# Patient Record
Sex: Male | Born: 1970 | Race: White | Hispanic: No | Marital: Married | Smoking: Former smoker
Health system: Southern US, Community
[De-identification: ages and names within clinical notes are randomized; demographics above are authoritative.]

## PROBLEM LIST (undated history)

## (undated) DIAGNOSIS — I472 Ventricular tachycardia, unspecified: Secondary | ICD-10-CM

## (undated) DIAGNOSIS — I428 Other cardiomyopathies: Secondary | ICD-10-CM

## (undated) DIAGNOSIS — E785 Hyperlipidemia, unspecified: Secondary | ICD-10-CM

## (undated) DIAGNOSIS — G709 Myoneural disorder, unspecified: Secondary | ICD-10-CM

## (undated) DIAGNOSIS — I10 Essential (primary) hypertension: Secondary | ICD-10-CM

## (undated) HISTORY — PX: VASECTOMY: SHX75

---
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
## 2011-11-25 ENCOUNTER — Encounter (HOSPITAL_COMMUNITY): Payer: Self-pay | Admitting: *Deleted

## 2011-11-25 ENCOUNTER — Emergency Department (HOSPITAL_COMMUNITY)
Admission: EM | Admit: 2011-11-25 | Discharge: 2011-11-25 | Disposition: A | Discharge status: Home / Self Care | Payer: 59 | Attending: Emergency Medicine | Admitting: Emergency Medicine

## 2011-11-25 DIAGNOSIS — R21 Rash and other nonspecific skin eruption: Secondary | ICD-10-CM | POA: Insufficient documentation

## 2011-11-25 DIAGNOSIS — L259 Unspecified contact dermatitis, unspecified cause: Secondary | ICD-10-CM | POA: Insufficient documentation

## 2011-11-25 LAB — CBC
HCT: 47.5 % (ref 39.0–52.0)
Hemoglobin: 16.7 g/dL (ref 13.0–17.0)
MCHC: 35.2 g/dL (ref 30.0–36.0)
RBC: 5.33 MIL/uL (ref 4.22–5.81)

## 2011-11-25 LAB — BASIC METABOLIC PANEL
BUN: 9 mg/dL (ref 6–23)
CO2: 21 mEq/L (ref 19–32)
GFR calc non Af Amer: 87 mL/min — ABNORMAL LOW (ref >90)
Glucose, Bld: 96 mg/dL (ref 70–99)
Potassium: 4.2 mEq/L (ref 3.5–5.1)
Sodium: 136 mEq/L (ref 135–145)

## 2011-11-25 MED ORDER — HYDROCORTISONE 2.5 % EX LOTN: TOPICAL_LOTION | Freq: Two times a day (BID) | CUTANEOUS | Status: AC

## 2011-11-25 MED ORDER — PREDNISONE 20 MG PO TABS: 60.0000 mg | ORAL_TABLET | Freq: Every day | ORAL | Status: AC

## 2011-11-25 MED ORDER — METHYLPREDNISOLONE SODIUM SUCC 125 MG IJ SOLR
125.0000 mg | INTRAMUSCULAR | Status: AC
  Administered 2011-11-25: 125 mg via INTRAVENOUS
  Filled 2011-11-25: qty 2

## 2011-11-25 MED ORDER — MORPHINE SULFATE 4 MG/ML IJ SOLN
4.0000 mg | Freq: Once | INTRAMUSCULAR | Status: AC
  Administered 2011-11-25: 4 mg via INTRAVENOUS
  Filled 2011-11-25: qty 1

## 2011-11-25 MED ORDER — TRAMADOL HCL 50 MG PO TABS: 50.0000 mg | ORAL_TABLET | Freq: Four times a day (QID) | ORAL | Status: AC | PRN

## 2011-11-25 MED ORDER — DIPHENHYDRAMINE HCL 50 MG/ML IJ SOLN
25.0000 mg | Freq: Once | INTRAMUSCULAR | Status: AC
  Administered 2011-11-25: 25 mg via INTRAVENOUS
  Filled 2011-11-25: qty 1

## 2011-11-25 NOTE — ED Notes (Signed)
Md informed of pt complain of severe itching to hands and thighs.

## 2011-11-25 NOTE — ED Notes (Signed)
Both his hands are red and swelling since yesterday and.  Redness all over his body now

## 2011-11-25 NOTE — ED Notes (Signed)
Rashes to left and right inner thigh, bilat hand swollen with raised red rashes, pt  stated  Severe itching and rashes painful.

## 2011-11-25 NOTE — ED Provider Notes (Signed)
History     CSN: [id number]  Arrival date & time 11/25/11  1742   First MD Initiated Contact with Patient 11/25/11 1754      Chief Complaint  Patient presents with  . swollen hands     (Consider location/radiation/quality/duration/timing/severity/associated sxs/prior treatment) Patient is a 41 y.o. male presenting with rash.  Rash  This is a new problem. The current episode started 6 to 12 hours ago. The problem has been rapidly worsening. The problem is associated with an unknown factor. There has been no fever. The rash is present on the groin, left hand, left wrist, right hand, right wrist, right ankle, right buttock, left buttock and left ankle. The pain is moderate. The pain has been constant since onset. Associated symptoms include itching and pain. Pertinent negatives include no blisters and no weeping. He has tried nothing for the symptoms. The treatment provided no relief. Risk factors: none.    History reviewed. No pertinent past medical history.  History reviewed. No pertinent past surgical history.  No family history on file.  History  Substance Use Topics  . Smoking status: Never Smoker   . Smokeless tobacco: Not on file  . Alcohol Use: No      Review of Systems  Unable to perform ROS Constitutional: Negative for fever and chills.  HENT: Negative for congestion, rhinorrhea and trouble swallowing.   Eyes: Negative for visual disturbance.  Respiratory: Negative for cough, chest tightness, shortness of breath and wheezing.   Cardiovascular: Negative for chest pain and palpitations.  Gastrointestinal: Negative for nausea, vomiting and abdominal pain.  Genitourinary: Negative for genital sores.  Musculoskeletal: Negative for myalgias and arthralgias.  Skin: Positive for itching and rash. Negative for color change and wound.  Neurological: Negative for light-headedness and headaches.  Hematological: Negative for adenopathy.  All other systems reviewed and are  negative.    Allergies  Review of patient's allergies indicates not on file.  Home Medications  No current outpatient prescriptions on file.  BP 124/87  Pulse 100  Temp 98.3 F (36.8 C) (Oral)  Resp 20  SpO2 98%  Physical Exam  Nursing note and vitals reviewed. Constitutional: He is oriented to person, place, and time. He appears well-developed and well-nourished.  HENT:  Head: Normocephalic and atraumatic.  Mouth/Throat: Oropharynx is clear and moist.  Eyes: EOM are normal. Pupils are equal, round, and reactive to light.  Cardiovascular: Normal rate and regular rhythm.   Pulmonary/Chest: Effort normal and breath sounds normal. No respiratory distress. He has no wheezes.  Abdominal: Soft. Bowel sounds are normal. There is no tenderness.  Neurological: He is alert and oriented to person, place, and time.  Skin: Skin is warm and dry.     Psychiatric: He has a normal mood and affect.    ED Course  Procedures (including critical care time)  Labs Reviewed  CBC - Abnormal; Notable for the following:    WBC 11.9 (*)     All other components within normal limits  BASIC METABOLIC PANEL - Abnormal; Notable for the following:    GFR calc non Af Amer 87 (*)     All other components within normal limits   No results found.   1. Contact dermatitis       MDM  This is a 41 year old male who presents with a rash and spine. He states that last night he had some itching to the volar surface of his bilateral wrists, and this morning woke up with some erythema and some  swelling of his bilateral wrists. He also noticed some raised areas scattered across the back of his hands and his anterior ankles which he described as "looking like pimples. He says that progressively throughout the day he has had significant swelling of his bilateral hands, making it difficult to move them, he is also had spread of the rash across his hands, his anterior ankles, and his groin area. The patient's  denies any known new exposures, has never had similar rashes before. He denies any shortness of breath, nausea, dizziness, lightheadedness, any other complaints. He exam is as above, and shows diffuse rash, but sparing the torso, back, and face. Will treat the patient's symptoms with Benadryl, steroids, and pain medication the rash this is below 1 the patient's ankles, as well as on the back of his left knee appear like a contact dermatitis, there are some areas on the hands which appears like a contact dermatitis however it is difficult to tell for sure due to the significant amount of swelling. The rash along his client appears more consistent with hives, and is uncertain if this is from the same exposure or from something else, but will be treated by the medications already being given for the contact dermatitis.  Labs with mild leukocytosis but otherwise unremarkable. Patient with improvement of the swelling in his hands bilaterally, with improved movement of his fingers. He has had improvement of his itching and his pain with the treatment provided. Discussed with the patient and he tripped at home, follow up with his regular doctor for further evaluation, and indications for return. The patient expressed understanding.      Theotis Burrow, MD 11/25/11 2255

## 2011-11-25 NOTE — ED Provider Notes (Signed)
I saw and evaluated the patient, reviewed the resident's note and I agree with the findings and plan.  Tyshaun Vinzant, MD 11/25/11 2352 

## 2014-06-30 ENCOUNTER — Emergency Department (HOSPITAL_COMMUNITY)
Admission: EM | Admit: 2014-06-30 | Discharge: 2014-06-30 | Discharge status: Against Medical Advice | Payer: 59 | Attending: Internal Medicine | Admitting: Internal Medicine

## 2014-06-30 ENCOUNTER — Emergency Department (HOSPITAL_COMMUNITY): Payer: 59

## 2014-06-30 DIAGNOSIS — I10 Essential (primary) hypertension: Secondary | ICD-10-CM | POA: Diagnosis not present

## 2014-06-30 DIAGNOSIS — I472 Ventricular tachycardia, unspecified: Secondary | ICD-10-CM

## 2014-06-30 DIAGNOSIS — R079 Chest pain, unspecified: Secondary | ICD-10-CM

## 2014-06-30 LAB — BASIC METABOLIC PANEL
Anion gap: 11 (ref 5–15)
BUN: 13 mg/dL (ref 6–23)
CALCIUM: 10.1 mg/dL (ref 8.4–10.5)
CO2: 24 mmol/L (ref 19–32)
CREATININE: 1.23 mg/dL (ref 0.50–1.35)
Chloride: 103 mmol/L (ref 96–112)
GFR calc Af Amer: 82 mL/min — ABNORMAL LOW (ref >90)
GFR, EST NON AFRICAN AMERICAN: 70 mL/min — AB (ref >90)
GLUCOSE: 162 mg/dL — AB (ref 70–99)
POTASSIUM: 3.9 mmol/L (ref 3.5–5.1)
SODIUM: 138 mmol/L (ref 135–145)

## 2014-06-30 LAB — CBC WITH DIFFERENTIAL/PLATELET
Basophils Absolute: 0 10*3/uL (ref 0.0–0.1)
Basophils Relative: 0 % (ref 0–1)
Eosinophils Absolute: 0.2 10*3/uL (ref 0.0–0.7)
Eosinophils Relative: 2 % (ref 0–5)
HCT: 48.4 % (ref 39.0–52.0)
Hemoglobin: 17 g/dL (ref 13.0–17.0)
Lymphocytes Relative: 36 % (ref 12–46)
Lymphs Abs: 3.5 10*3/uL (ref 0.7–4.0)
MCH: 31.5 pg (ref 26.0–34.0)
MCHC: 35.1 g/dL (ref 30.0–36.0)
MCV: 89.8 fL (ref 78.0–100.0)
MONO ABS: 0.6 10*3/uL (ref 0.1–1.0)
Monocytes Relative: 6 % (ref 3–12)
NEUTROS ABS: 5.3 10*3/uL (ref 1.7–7.7)
NEUTROS PCT: 56 % (ref 43–77)
PLATELETS: 248 10*3/uL (ref 150–400)
RBC: 5.39 MIL/uL (ref 4.22–5.81)
RDW: 13.4 % (ref 11.5–15.5)
WBC: 9.6 10*3/uL (ref 4.0–10.5)

## 2014-06-30 LAB — MAGNESIUM: Magnesium: 2.1 mg/dL (ref 1.5–2.5)

## 2014-06-30 LAB — TROPONIN I: Troponin I: 0.07 ng/mL — ABNORMAL HIGH (ref <0.031)

## 2014-06-30 LAB — I-STAT TROPONIN, ED: Troponin i, poc: 0.04 ng/mL (ref 0.00–0.08)

## 2014-06-30 MED ORDER — AMIODARONE HCL IN DEXTROSE 360-4.14 MG/200ML-% IV SOLN
60.0000 mg/h | Freq: Once | INTRAVENOUS | Status: AC
  Administered 2014-06-30: 60 mg/h via INTRAVENOUS
  Filled 2014-06-30: qty 200

## 2014-06-30 MED ORDER — HEPARIN BOLUS VIA INFUSION
4000.0000 [IU] | Freq: Once | INTRAVENOUS | Status: DC
  Filled 2014-06-30: qty 4000

## 2014-06-30 MED ORDER — ASPIRIN 81 MG PO CHEW
324.0000 mg | CHEWABLE_TABLET | ORAL | Status: DC
  Filled 2014-06-30: qty 4

## 2014-06-30 MED ORDER — MIDAZOLAM HCL 2 MG/2ML IJ SOLN
INTRAMUSCULAR | Status: AC
  Filled 2014-06-30: qty 2

## 2014-06-30 MED ORDER — NITROGLYCERIN 0.4 MG SL SUBL: 0.4000 mg | SUBLINGUAL_TABLET | SUBLINGUAL | Status: DC | PRN

## 2014-06-30 MED ORDER — AMIODARONE IV BOLUS ONLY 150 MG/100ML
150.0000 mg | Freq: Once | INTRAVENOUS | Status: AC
  Administered 2014-06-30: 150 mg via INTRAVENOUS

## 2014-06-30 MED ORDER — ACETAMINOPHEN 325 MG PO TABS: 650.0000 mg | ORAL_TABLET | ORAL | Status: DC | PRN

## 2014-06-30 MED ORDER — ASPIRIN EC 81 MG PO TBEC: 81.0000 mg | DELAYED_RELEASE_TABLET | Freq: Every day | ORAL | Status: DC

## 2014-06-30 MED ORDER — ONDANSETRON HCL 4 MG/2ML IJ SOLN: 4.0000 mg | Freq: Four times a day (QID) | INTRAMUSCULAR | Status: DC | PRN

## 2014-06-30 MED ORDER — ASPIRIN 300 MG RE SUPP: 300.0000 mg | RECTAL | Status: DC

## 2014-06-30 MED ORDER — HEPARIN (PORCINE) IN NACL 100-0.45 UNIT/ML-% IJ SOLN
1100.0000 [IU]/h | INTRAMUSCULAR | Status: DC
  Filled 2014-06-30: qty 250

## 2014-06-30 MED ORDER — ETOMIDATE 2 MG/ML IV SOLN
INTRAVENOUS | Status: AC
  Filled 2014-06-30: qty 10

## 2014-06-30 NOTE — ED Notes (Signed)
Pt states that the chest pain and pressure have subsided since his heart rate has converted

## 2014-06-30 NOTE — ED Notes (Signed)
Pt is in a gown and on the monitor. Family is at bedside

## 2014-06-30 NOTE — Progress Notes (Signed)
ANTICOAGULATION CONSULT NOTE - Initial Consult  Pharmacy Consult for heparin Indication: chest pain/ACS  No Known Allergies  Patient Measurements: Height: 5\' 10"  (177.8 cm) Weight: 200 lb (90.719 kg) IBW/kg (Calculated) : 73 Heparin Dosing Weight: 90kg  Vital Signs: Temp: 98.3 F (36.8 C) (02/07 1500) Temp Source: Oral (02/07 1500) BP: 113/85 mmHg (02/07 1600) Pulse Rate: 87 (02/07 1600)  Labs:  Recent Labs  06/30/14 1509  HGB 17.0  HCT 48.4  PLT 248  CREATININE 1.23  TROPONINI 0.07*    Estimated Creatinine Clearance: 87.7 mL/min (by C-G formula based on Cr of 1.23).   Medical History: No past medical history on file.  Medications:  Infusions:  . heparin    . heparin      Assessment: 29 yom presented to the ED with CP and near syncope. Troponin elevated. To start IV heparin for anticoagulation. Baseline CBC is WNL. He is not on any anticoagulation PTA.   Goal of Therapy:  Heparin level 0.3-0.7 units/ml Monitor platelets by anticoagulation protocol: Yes   Plan:  1. Heparin bolus 4000 units IV x 1 2. Heparin gtt 1100 units/hr 3. Check a 6 hour HL 4. Daily HL and CBC  Nacole Fluhr, Drake Leach 06/30/2014,5:06 PM

## 2014-06-30 NOTE — ED Notes (Signed)
Before starting the amiodarone gtt pt converted to a SR , repeat ekg done, amiodarone bolus given and gtt started

## 2014-06-30 NOTE — ED Provider Notes (Signed)
CSN: [id number]     Arrival date & time 06/30/14  1455 History   First MD Initiated Contact with Patient 06/30/14 1507     Chief Complaint  Patient presents with  . Chest Pain     (Consider location/radiation/quality/duration/timing/severity/associated sxs/prior Treatment) Patient is a 44 y.o. male presenting with chest pain. The history is provided by the patient. No language interpreter was used.  Chest Pain Pain location:  Substernal area Pain quality: pressure   Pain radiates to:  Does not radiate Pain radiates to the back: no   Pain severity:  Severe Onset quality:  Sudden Duration:  1 hour Timing:  Constant Progression:  Unchanged Chronicity:  New Context: not breathing, no drug use, not eating, not lifting, no movement, not at rest and no stress   Relieved by:  Nothing Worsened by:  Nothing tried Ineffective treatments:  None tried Associated symptoms: no abdominal pain, no anxiety, no back pain, no cough, no fever, no nausea, no numbness, no orthopnea, no syncope, not vomiting and no weakness   Risk factors: male sex   Risk factors: no aortic disease, no birth control, no coronary artery disease, no diabetes mellitus, no high cholesterol, no hypertension and no prior DVT/PE     No past medical history on file. No past surgical history on file. No family history on file. History  Substance Use Topics  . Smoking status: Never Smoker   . Smokeless tobacco: Not on file  . Alcohol Use: No    Review of Systems  Constitutional: Negative for fever.  Respiratory: Negative for cough.   Cardiovascular: Positive for chest pain. Negative for orthopnea and syncope.  Gastrointestinal: Negative for nausea, vomiting and abdominal pain.  Genitourinary: Negative for dysuria, urgency and frequency.  Musculoskeletal: Negative for back pain.  Neurological: Negative for weakness and numbness.  All other systems reviewed and are negative.     Allergies  Review of patient's  allergies indicates no known allergies.  Home Medications   Prior to Admission medications   Medication Sig Start Date End Date Taking? Authorizing Provider  Aspirin-Acetaminophen-Caffeine (GOODY HEADACHE PO) Take 1 packet by mouth daily as needed (pain). For headache   Yes Historical Provider, MD   BP 113/85 mmHg  Pulse 87  Temp(Src) 98.3 F (36.8 C) (Oral)  Resp 25  Ht  (1.778 m)  Wt 200 lb (90.719 kg)  BMI 28.70 kg/m2  SpO2 96% Physical Exam  Constitutional: He is oriented to person, place, and time. He appears well-developed and well-nourished. No distress.  HENT:  Head: Normocephalic and atraumatic.  Eyes: Pupils are equal, round, and reactive to light.  Neck: Normal range of motion.  Cardiovascular: Regular rhythm and normal heart sounds.  Tachycardia present.   Pulmonary/Chest: Effort normal and breath sounds normal. No respiratory distress. He has no decreased breath sounds. He has no wheezes. He has no rhonchi. He has no rales.  Abdominal: Soft. He exhibits no distension. There is no tenderness. There is no rebound and no guarding.  Musculoskeletal: He exhibits no edema or tenderness.  Neurological: He is alert and oriented to person, place, and time. He exhibits normal muscle tone.  Skin: Skin is warm and dry.  Nursing note and vitals reviewed.   ED Course  Procedures (including critical care time) Labs Review Labs Reviewed  BASIC METABOLIC PANEL - Abnormal; Notable for the following:    Glucose, Bld 162 (*)    GFR calc non Af Amer 70 (*)    GFR calc  Af Amer 82 (*)    All other components within normal limits  TROPONIN I - Abnormal; Notable for the following:    Troponin I 0.07 (*)    All other components within normal limits  CBC WITH DIFFERENTIAL/PLATELET  MAGNESIUM  HEPARIN LEVEL (UNFRACTIONATED)  CBC  I-STAT TROPOININ, ED    Imaging Review Dg Chest Port 1 View  06/30/2014   CLINICAL DATA:  Acute onset of chest pain, palpitations and shortness of  breath which began earlier today. Patient had a ventricular arrhythmia on EKG in the emergency department which was converted and the patient has received a bolus of amiodarone.  EXAM: PORTABLE CHEST - 1 VIEW  COMPARISON:  None.  FINDINGS: Cardiac silhouette mildly to moderately enlarged even allowing for the AP portable technique. Elevation of right hemidiaphragm with associated volume loss in the right lower lobe. Lungs otherwise clear. No localized airspace consolidation. No pleural effusions. No pneumothorax. Normal pulmonary vascularity. External pacing pads.  IMPRESSION: Cardiomegaly. Elevation of the right hemidiaphragm with likely chronic scar or atelectasis in the right lower lobe. No acute cardiopulmonary disease otherwise.   Electronically Signed   By: Hulan Saas M.D.   On: 06/30/2014 16:15     EKG Interpretation   Date/Time:  Sunday June 30 2014 15:21:38 EST Ventricular Rate:  99 PR Interval:  212 QRS Duration: 100 QT Interval:  324 QTC Calculation: 416 R Axis:   104 Text Interpretation:  Sinus tachycardia Ventricular premature complex  Prolonged PR interval Right axis deviation Repol abnrm, severe global  ischemia (LM/MVD) Baseline wander in lead(s) V3 Confirmed by ZAVITZ  MD,  JOSHUA ([id number]) on 06/30/2014 4:55:29 PM      MDM   Final diagnoses:  Chest pain  Ventricular tachycardia   Patient is a 44 year old Caucasian male with past medical history of hypertension who comes to the emergency department today with left-sided chest pressure for the past hour. Physical exam as above. Upon arrival patient is tachycardic with heart rate in the 211s. Initial workup included an EKG which is detailed above. EKG is concerning for ventricular fibrillation. Patient has a blood pressure of 90/50. He was felt to require cardioversion with his chest pain and hypotension with ventricular fibrillation. While being set up for cardioversion patient spontaneously cardioverted to normal sinus  rhythm. Initial workup included an i-STAT troponin, BMP, CBC, magnesium, and chest x-ray.  After conversion patient's symptoms completely resolved with no hypotension and no shortness of breath or chest pressure. Patient's care was discussed with cardiology who felt the patient required admission to the hospital. Prior to admission patient left AMA.  Cardiology held the conversations regarding patient's reason for leaving AMA with the patient and his family. Labs and imaging reviewed by myself and considered in medical decision making. Imaging interpreted by radiology. Care was discussed with my attending Dr. Jodi Mourning.       Bethann Berkshire, MD 06/30/14 1951  Enid Skeens, MD 06/30/14 [id number]

## 2014-06-30 NOTE — H&P (Signed)
James Fowler is an 44 y.o. male.    Chief Complaint: palpitations and near syncope  HPI: The patient is a very pleasant 44 year old man whose history dates back 9 years when he presented to an outside hospital in [place] with palpitations. Workup at that time is uncertain. He did well until today when he presented after experiencing sudden onset of palpitations associated with near syncope. These episodes were associated with shortness of breath and chest pressure. He presented to the emergency room where he was in a wide QRS tachycardia at 213 bpm. This was a left bundle indeterminate axis tachycardia. It looks like ventricular tachycardia. Just as he was about to be cardioverted, he spontaneously reverted to sinus rhythm. Just prior to conversion, his tachycardia became polymorphic. He feels better. He denies shortness of breath or chest pressure at baseline. He is not limited in his activity. He has never had syncope and there is no family history of sudden cardiac death.  No past medical history on file. He has no significant past medical history with the exception of the episode of palpitations which occurred 9 years ago. No past surgical history on file. No prior surgeries No family history on file. Social History:  reports that he has never smoked. He does not have any smokeless tobacco history on file. He reports that he does not drink alcohol. His drug history is not on file.  Allergies: No Known Allergies Medications - he is on no medications  (Not in a hospital admission)  Results for orders placed or performed during the hospital encounter of 06/30/14 (from the past 48 hour(s))  Basic metabolic panel     Status: Abnormal   Collection Time: 06/30/14  3:09 PM  Result Value Ref Range   Sodium 138 135 - 145 mmol/L   Potassium 3.9 3.5 - 5.1 mmol/L   Chloride 103 96 - 112 mmol/L   CO2 24 19 - 32 mmol/L   Glucose, Bld 162 (H) 70 - 99 mg/dL   BUN 13 6 - 23 mg/dL   Creatinine, Ser  1.23 0.50 - 1.35 mg/dL   Calcium 10.1 8.4 - 10.5 mg/dL   GFR calc non Af Amer 70 (L) >90 mL/min   GFR calc Af Amer 82 (L) >90 mL/min    Comment: (NOTE) The eGFR has been calculated using the CKD EPI equation. This calculation has not been validated in all clinical situations. eGFR's persistently <90 mL/min signify possible Chronic Kidney Disease.    Anion gap 11 5 - 15  CBC with Differential/Platelet     Status: None   Collection Time: 06/30/14  3:09 PM  Result Value Ref Range   WBC 9.6 4.0 - 10.5 K/uL   RBC 5.39 4.22 - 5.81 MIL/uL   Hemoglobin 17.0 13.0 - 17.0 g/dL   HCT 48.4 39.0 - 52.0 %   MCV 89.8 78.0 - 100.0 fL   MCH 31.5 26.0 - 34.0 pg   MCHC 35.1 30.0 - 36.0 g/dL   RDW 13.4 11.5 - 15.5 %   Platelets 248 150 - 400 K/uL   Neutrophils Relative % 56 43 - 77 %   Neutro Abs 5.3 1.7 - 7.7 K/uL   Lymphocytes Relative 36 12 - 46 %   Lymphs Abs 3.5 0.7 - 4.0 K/uL   Monocytes Relative 6 3 - 12 %   Monocytes Absolute 0.6 0.1 - 1.0 K/uL   Eosinophils Relative 2 0 - 5 %   Eosinophils Absolute 0.2 0.0 - 0.7 K/uL  Basophils Relative 0 0 - 1 %   Basophils Absolute 0.0 0.0 - 0.1 K/uL  Troponin I     Status: Abnormal   Collection Time: 06/30/14  3:09 PM  Result Value Ref Range   Troponin I 0.07 (H) <0.031 ng/mL    Comment:        PERSISTENTLY INCREASED TROPONIN VALUES IN THE RANGE OF 0.04-0.49 ng/mL CAN BE SEEN IN:       -UNSTABLE ANGINA       -CONGESTIVE HEART FAILURE       -MYOCARDITIS       -CHEST TRAUMA       -ARRYHTHMIAS       -LATE PRESENTING MYOCARDIAL INFARCTION       -COPD   CLINICAL FOLLOW-UP RECOMMENDED.   Magnesium     Status: None   Collection Time: 06/30/14  3:09 PM  Result Value Ref Range   Magnesium 2.1 1.5 - 2.5 mg/dL  I-stat troponin, ED     Status: None   Collection Time: 06/30/14  3:40 PM  Result Value Ref Range   Troponin i, poc 0.04 0.00 - 0.08 ng/mL   Comment 3            Comment: Due to the release kinetics of cTnI, a negative result within  the first hours of the onset of symptoms does not rule out myocardial infarction with certainty. If myocardial infarction is still suspected, repeat the test at appropriate intervals.    Dg Chest Port 1 View  06/30/2014   CLINICAL DATA:  Acute onset of chest pain, palpitations and shortness of breath which began earlier today. Patient had a ventricular arrhythmia on EKG in the emergency department which was converted and the patient has received a bolus of amiodarone.  EXAM: PORTABLE CHEST - 1 VIEW  COMPARISON:  None.  FINDINGS: Cardiac silhouette mildly to moderately enlarged even allowing for the AP portable technique. Elevation of right hemidiaphragm with associated volume loss in the right lower lobe. Lungs otherwise clear. No localized airspace consolidation. No pleural effusions. No pneumothorax. Normal pulmonary vascularity. External pacing pads.  IMPRESSION: Cardiomegaly. Elevation of the right hemidiaphragm with likely chronic scar or atelectasis in the right lower lobe. No acute cardiopulmonary disease otherwise.   Electronically Signed   By: Evangeline Dakin M.D.   On: 06/30/2014 16:15    ROS  Blood pressure 113/85, pulse 87, temperature 98.3 F (36.8 C), temperature source Oral, resp. rate 25, height '5\' 10"'  (1.778 m), weight 200 lb (90.719 kg), SpO2 96 %. Physical Exam  Well appearing 44 year old man, NAD HEENT: Unremarkable,[place], AT Neck:  7 cm JVD, no thyromegally Back:  No CVA tenderness Lungs:  Clear with no wheezes, rales, or rhonchi HEART:  Regular rate rhythm, no murmurs, no rubs, no clicks Abd:  soft, positive bowel sounds, no organomegally, no rebound, no guarding Ext:  2 plus pulses, no edema, no cyanosis, no clubbing Skin:  No rashes no nodules Neuro:  CN II through XII intact, motor grossly intact  EKG: Left bundle-branch wide QRS tachycardia at 213 beats minute  Assessment/Plan 1. Probable ventricular tachycardia 2. Palpitations Discussion: The patient probably  has ventricular tachycardia which terminated spontaneously and was associated with hemodynamic instability. He needs cardiac catheterization and evaluation of left ventricular function. Additional recommendations including electrophysiologic testing will be forthcoming. I discussed the potential life-threatening nature of this problem with the patient and his wife who understand. He was initially started on amiodarone in the emergency room. This will be  discontinued. We will observe him in the ICU. He will be ruled out for MI.  Cristopher Peru 06/30/2014, 4:45 PM

## 2014-06-30 NOTE — ED Notes (Signed)
Pt reports chest pain and SOB that started earlier today. States that he feels like his heart is racing. States he has had problems with his heart in the past but unsure of what happened.

## 2014-06-30 NOTE — ED Notes (Signed)
Pt signed out  AMA , pt refused further treatment , Cards MD into see pt ,pt still signed out [facility]

## 2014-06-30 NOTE — Progress Notes (Signed)
Brief X-Cover Note  I was notified by the ER team that [name] was leaving AMA. I came down immediately and he had already signed out and had his IV taken out. I still attempted to discuss our findings of VT and need for further evaluation for a potentially life threatening medical condition. He was adamant about leaving and said he had an appointment with his PCP In the morning. I reinforced and had him repeat that he understand he was leaving against medical recommendation with a potentially life threatening disorder; he expressed his understanding. I told him to call 911 with any recurrent symptoms, concerns or changes in condition.   Leeann Must, MD

## 2014-06-30 NOTE — ED Notes (Signed)
Pt placed on zoll pads , placed on O2 at 4 liters ,

## 2014-07-01 ENCOUNTER — Inpatient Hospital Stay (HOSPITAL_COMMUNITY)
Admission: EM | Admit: 2014-07-01 | Discharge: 2014-07-05 | DRG: 225 | Disposition: A | Discharge status: Home / Self Care | Payer: 59 | Attending: Internal Medicine | Admitting: Internal Medicine

## 2014-07-01 ENCOUNTER — Encounter (HOSPITAL_COMMUNITY): Payer: Self-pay | Admitting: Emergency Medicine

## 2014-07-01 DIAGNOSIS — E780 Pure hypercholesterolemia: Secondary | ICD-10-CM | POA: Diagnosis present

## 2014-07-01 DIAGNOSIS — R000 Tachycardia, unspecified: Secondary | ICD-10-CM

## 2014-07-01 DIAGNOSIS — I472 Ventricular tachycardia, unspecified: Secondary | ICD-10-CM

## 2014-07-01 DIAGNOSIS — I447 Left bundle-branch block, unspecified: Secondary | ICD-10-CM | POA: Diagnosis present

## 2014-07-01 DIAGNOSIS — E785 Hyperlipidemia, unspecified: Secondary | ICD-10-CM | POA: Diagnosis present

## 2014-07-01 DIAGNOSIS — I10 Essential (primary) hypertension: Secondary | ICD-10-CM | POA: Diagnosis present

## 2014-07-01 DIAGNOSIS — I493 Ventricular premature depolarization: Secondary | ICD-10-CM | POA: Diagnosis present

## 2014-07-01 DIAGNOSIS — Z95810 Presence of automatic (implantable) cardiac defibrillator: Secondary | ICD-10-CM

## 2014-07-01 DIAGNOSIS — I429 Cardiomyopathy, unspecified: Secondary | ICD-10-CM

## 2014-07-01 HISTORY — DX: Hyperlipidemia, unspecified: E78.5

## 2014-07-01 HISTORY — DX: Ventricular tachycardia, unspecified: I47.20

## 2014-07-01 HISTORY — DX: Essential (primary) hypertension: I10

## 2014-07-01 HISTORY — DX: Other cardiomyopathies: I42.8

## 2014-07-01 HISTORY — DX: Ventricular tachycardia: I47.2

## 2014-07-01 LAB — CBC WITH DIFFERENTIAL/PLATELET
BASOS ABS: 0 10*3/uL (ref 0.0–0.1)
Basophils Relative: 1 % (ref 0–1)
EOS ABS: 0.1 10*3/uL (ref 0.0–0.7)
Eosinophils Relative: 2 % (ref 0–5)
HCT: 45 % (ref 39.0–52.0)
HEMOGLOBIN: 15.8 g/dL (ref 13.0–17.0)
LYMPHS PCT: 32 % (ref 12–46)
Lymphs Abs: 2 10*3/uL (ref 0.7–4.0)
MCH: 31.5 pg (ref 26.0–34.0)
MCHC: 35.1 g/dL (ref 30.0–36.0)
MCV: 89.6 fL (ref 78.0–100.0)
MONO ABS: 0.5 10*3/uL (ref 0.1–1.0)
Monocytes Relative: 9 % (ref 3–12)
NEUTROS ABS: 3.4 10*3/uL (ref 1.7–7.7)
Neutrophils Relative %: 56 % (ref 43–77)
PLATELETS: 220 10*3/uL (ref 150–400)
RBC: 5.02 MIL/uL (ref 4.22–5.81)
RDW: 13.6 % (ref 11.5–15.5)
WBC: 6.1 10*3/uL (ref 4.0–10.5)

## 2014-07-01 LAB — RAPID URINE DRUG SCREEN, HOSP PERFORMED
Amphetamines: NOT DETECTED
BARBITURATES: NOT DETECTED
Benzodiazepines: NOT DETECTED
Cocaine: NOT DETECTED
OPIATES: NOT DETECTED
TETRAHYDROCANNABINOL: NOT DETECTED

## 2014-07-01 LAB — BASIC METABOLIC PANEL
ANION GAP: 3 — AB (ref 5–15)
BUN: 11 mg/dL (ref 6–23)
CO2: 25 mmol/L (ref 19–32)
Calcium: 10 mg/dL (ref 8.4–10.5)
Chloride: 114 mmol/L — ABNORMAL HIGH (ref 96–112)
Creatinine, Ser: 0.97 mg/dL (ref 0.50–1.35)
GFR calc non Af Amer: >90 mL/min (ref >90)
Glucose, Bld: 90 mg/dL (ref 70–99)
Potassium: 4 mmol/L (ref 3.5–5.1)
SODIUM: 142 mmol/L (ref 135–145)

## 2014-07-01 LAB — TROPONIN I: Troponin I: 0.53 ng/mL (ref <0.031)

## 2014-07-01 MED ORDER — SODIUM CHLORIDE 0.9 % IV SOLN: 250.0000 mL | INTRAVENOUS | Status: DC | PRN

## 2014-07-01 MED ORDER — SODIUM CHLORIDE 0.9 % IV SOLN
INTRAVENOUS | Status: DC
Start: 2014-07-02 — End: 2014-07-02
  Administered 2014-07-02: 06:00:00 via INTRAVENOUS

## 2014-07-01 MED ORDER — SODIUM CHLORIDE 0.9 % IJ SOLN: 3.0000 mL | Freq: Two times a day (BID) | INTRAMUSCULAR | Status: DC

## 2014-07-01 MED ORDER — ACETAMINOPHEN 325 MG PO TABS: 650.0000 mg | ORAL_TABLET | ORAL | Status: DC | PRN

## 2014-07-01 MED ORDER — SODIUM CHLORIDE 0.9 % IV SOLN
INTRAVENOUS | Status: DC
  Administered 2014-07-01: 20 mL/h via INTRAVENOUS

## 2014-07-01 MED ORDER — NITROGLYCERIN 0.4 MG SL SUBL: 0.4000 mg | SUBLINGUAL_TABLET | SUBLINGUAL | Status: DC | PRN

## 2014-07-01 MED ORDER — ASPIRIN EC 81 MG PO TBEC: 81.0000 mg | DELAYED_RELEASE_TABLET | Freq: Every day | ORAL | Status: DC

## 2014-07-01 MED ORDER — ASPIRIN 300 MG RE SUPP: 300.0000 mg | RECTAL | Status: AC

## 2014-07-01 MED ORDER — ASPIRIN 81 MG PO CHEW
324.0000 mg | CHEWABLE_TABLET | ORAL | Status: AC
  Administered 2014-07-01: 324 mg via ORAL
  Filled 2014-07-01: qty 4

## 2014-07-01 MED ORDER — SODIUM CHLORIDE 0.9 % IJ SOLN
3.0000 mL | Freq: Two times a day (BID) | INTRAMUSCULAR | Status: DC
  Administered 2014-07-02 – 2014-07-03 (×2): 3 mL via INTRAVENOUS

## 2014-07-01 MED ORDER — ASPIRIN 81 MG PO CHEW
81.0000 mg | CHEWABLE_TABLET | ORAL | Status: AC
  Administered 2014-07-02: 81 mg via ORAL
  Filled 2014-07-01: qty 1

## 2014-07-01 MED ORDER — SODIUM CHLORIDE 0.9 % IJ SOLN: 3.0000 mL | INTRAMUSCULAR | Status: DC | PRN

## 2014-07-01 MED ORDER — HEPARIN SODIUM (PORCINE) 5000 UNIT/ML IJ SOLN
5000.0000 [IU] | Freq: Three times a day (TID) | INTRAMUSCULAR | Status: DC
  Administered 2014-07-01 – 2014-07-02 (×2): 5000 [IU] via SUBCUTANEOUS
  Filled 2014-07-01 (×2): qty 1

## 2014-07-01 MED ORDER — ONDANSETRON HCL 4 MG/2ML IJ SOLN: 4.0000 mg | Freq: Four times a day (QID) | INTRAMUSCULAR | Status: DC | PRN

## 2014-07-01 MED FILL — Medication: Qty: 1 | Status: AC

## 2014-07-01 NOTE — ED Notes (Addendum)
Pt was her in [facility] yesterday, ended up signing out AMA.

## 2014-07-01 NOTE — ED Notes (Signed)
Dr. Allen at the bedside.  
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
## 2014-07-01 NOTE — ED Notes (Signed)
Per patient, states he was here yesterday and left AMA, states his HR was 220.  Someone from this facility had called his primary care physician and that office had called him and asked him to come back to be reevaluated.  Denies cp, lightheadedness, dizziness, sob.

## 2014-07-01 NOTE — ED Notes (Signed)
Pt placed on monitor upon arrival to room from resteroom. Pt monitored by blood pressure, pulse ox, and 12 lead.

## 2014-07-01 NOTE — ED Notes (Signed)
Dr Allen at bedside  
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
## 2014-07-02 ENCOUNTER — Encounter (HOSPITAL_COMMUNITY)
Admission: EM | Disposition: A | Discharge status: Home / Self Care | Payer: Self-pay | Source: Home / Self Care | Attending: Internal Medicine

## 2014-07-02 ENCOUNTER — Observation Stay (HOSPITAL_COMMUNITY): Payer: 59

## 2014-07-02 ENCOUNTER — Encounter (HOSPITAL_COMMUNITY): Payer: Self-pay | Admitting: *Deleted

## 2014-07-02 DIAGNOSIS — I472 Ventricular tachycardia: Principal | ICD-10-CM

## 2014-07-02 DIAGNOSIS — E785 Hyperlipidemia, unspecified: Secondary | ICD-10-CM

## 2014-07-02 DIAGNOSIS — I10 Essential (primary) hypertension: Secondary | ICD-10-CM

## 2014-07-02 DIAGNOSIS — R000 Tachycardia, unspecified: Secondary | ICD-10-CM

## 2014-07-02 HISTORY — PX: LEFT HEART CATHETERIZATION WITH CORONARY ANGIOGRAM: SHX5451

## 2014-07-02 LAB — TSH: TSH: 3.867 u[IU]/mL (ref 0.350–4.500)

## 2014-07-02 LAB — PROTIME-INR
INR: 0.94 (ref 0.00–1.49)
Prothrombin Time: 12.6 seconds (ref 11.6–15.2)

## 2014-07-02 SURGERY — LEFT HEART CATHETERIZATION WITH CORONARY ANGIOGRAM

## 2014-07-02 MED ORDER — LIDOCAINE HCL (PF) 1 % IJ SOLN
INTRAMUSCULAR | Status: AC
  Filled 2014-07-02: qty 30

## 2014-07-02 MED ORDER — MIDAZOLAM HCL 2 MG/2ML IJ SOLN
INTRAMUSCULAR | Status: AC
  Filled 2014-07-02: qty 2

## 2014-07-02 MED ORDER — HEPARIN (PORCINE) IN NACL 2-0.9 UNIT/ML-% IJ SOLN
INTRAMUSCULAR | Status: AC
  Filled 2014-07-02: qty 1500

## 2014-07-02 MED ORDER — VERAPAMIL HCL 2.5 MG/ML IV SOLN
INTRAVENOUS | Status: AC
  Filled 2014-07-02: qty 2

## 2014-07-02 MED ORDER — HEPARIN SODIUM (PORCINE) 1000 UNIT/ML IJ SOLN
INTRAMUSCULAR | Status: AC
  Filled 2014-07-02: qty 1

## 2014-07-02 MED ORDER — NITROGLYCERIN 1 MG/10 ML FOR IR/CATH LAB
INTRA_ARTERIAL | Status: AC
  Filled 2014-07-02: qty 10

## 2014-07-02 MED ORDER — ASPIRIN EC 81 MG PO TBEC
81.0000 mg | DELAYED_RELEASE_TABLET | Freq: Every day | ORAL | Status: DC
  Administered 2014-07-03 – 2014-07-04 (×2): 81 mg via ORAL
  Filled 2014-07-02 (×3): qty 1

## 2014-07-02 MED ORDER — ACETAMINOPHEN 325 MG PO TABS: 650.0000 mg | ORAL_TABLET | ORAL | Status: DC | PRN

## 2014-07-02 MED ORDER — ONDANSETRON HCL 4 MG/2ML IJ SOLN: 4.0000 mg | Freq: Four times a day (QID) | INTRAMUSCULAR | Status: DC | PRN

## 2014-07-02 MED ORDER — SODIUM CHLORIDE 0.9 % IV SOLN
INTRAVENOUS | Status: DC
  Administered 2014-07-02 (×2): via INTRAVENOUS

## 2014-07-02 MED ORDER — ASPIRIN EC 81 MG PO TBEC: 81.0000 mg | DELAYED_RELEASE_TABLET | Freq: Every day | ORAL | Status: DC

## 2014-07-02 MED ORDER — GADOBENATE DIMEGLUMINE 529 MG/ML IV SOLN
30.0000 mL | Freq: Once | INTRAVENOUS | Status: AC | PRN
  Administered 2014-07-02: 30 mL via INTRAVENOUS

## 2014-07-02 MED ORDER — FENTANYL CITRATE 0.05 MG/ML IJ SOLN
INTRAMUSCULAR | Status: AC
  Filled 2014-07-02: qty 2

## 2014-07-02 NOTE — Progress Notes (Signed)
UR completed 

## 2014-07-02 NOTE — Consult Note (Addendum)
ELECTROPHYSIOLOGY CONSULT NOTE    Patient ID: James Fowler MRN: [id number], DOB/AGE: 11/11/1970 44 y.o.  Admit date: 07/01/2014 Date of Consult: 07/02/2014  Primary Physician: Haze Rushing, MD Primary Cardiologist: new to [facility]  Reason for Consultation: VT  HPI:  James Fowler is a 44 y.o. male with a past medical history of palpitations, hyperlipidemia, and hypertension.  He reports extensive work up about 9 years ago at hospital in [place], but no records were found when release was faxed.  He presented to the ER 06-30-14 with near syncope, palpitations, shortness of breath and chest pressure. He was found to be in a LBB L axis WCT at a rate of 213bpm.  Just prior to cardioversion, his tachycardia became polymorphic and self terminated.  Admission was recommended, but the patient left AMA.  He then called his PCP yesterday morning who recommended inpatient evaluation and he again presented to the ER.    Cardiac catheterization this morning demonstrated no CAD with normal EF.  Echocardiogram is pending.  There is no family history of sudden cardiac death.  He had 1 prior syncopal spell 9 years ago when he was admitted in [place]. He has had 1 other episode of palpitations that occurred 5 years ago and lasted less than a minute.  He currently denies chest pain, shortness of breath, recent fevers, chills, nausea or vomiting.  ROS is otherwise negative.   Lab work is notable for troponin of 0.53, negative UDS.   He does not smoke, use alcohol, or recreational drugs.  He works for the [employer] and denies any recent increased stressors.   EP has been asked to evaluate for treatment options.   Past Medical History  Diagnosis Date  . Hyperlipidemia   . Hypertension      Surgical History:  Past Surgical History  Procedure Laterality Date  . Arm surgery  1991    2 steel plates in right arm     Prescriptions prior to admission  Medication Sig Dispense Refill Last  Dose  . Aspirin-Acetaminophen-Caffeine (GOODY HEADACHE PO) Take 1 packet by mouth daily as needed (pain). For headache   06/30/2014 at Unknown time    Inpatient Medications:  . aspirin EC  81 mg Oral Daily  . heparin  5,000 Units Subcutaneous 3 times per day  . sodium chloride  3 mL Intravenous Q12H    Allergies: No Known Allergies  History   Social History  . Marital Status: Married    Spouse Name: N/A    Number of Children: N/A  . Years of Education: N/A   Occupational History  . Not on file.   Social History Main Topics  . Smoking status: Never Smoker   . Smokeless tobacco: Not on file  . Alcohol Use: No  . Drug Use: No  . Sexual Activity: Not on file   Other Topics Concern  . Not on file   Social History Narrative     Family History  Problem Relation Age of Onset  . Alcohol abuse Mother   . Mental illness Father     committed suicide     BP 136/93 mmHg  Pulse 71  Temp(Src) 98.2 F (36.8 C) (Oral)  Resp 18  Ht  (1.778 m)  Wt 199 lb 11.8 oz (90.6 kg)  BMI 28.66 kg/m2  SpO2 99%  Physical Exam: Filed Vitals:   07/01/14 1833 07/01/14 2015 07/02/14 0430 07/02/14 0955  BP: 132/88 126/89 136/93   Pulse: 52 65 72  71  Temp: 98.3 F (36.8 C) 97.9 F (36.6 C) 98.2 F (36.8 C)   TempSrc: Oral Oral Oral   Resp: Height:  (1.778 m)     Weight: 200 lb (90.719 kg)  199 lb 11.8 oz (90.6 kg)   SpO2: 96% 96% 99%     GEN- The patient is well appearing, alert and oriented x 3 today.   Head- normocephalic, atraumatic Eyes-  Sclera clear, conjunctiva pink Ears- hearing intact Oropharynx- clear Neck- supple, Lungs- Clear to ausculation bilaterally, normal work of breathing Heart- Regular rate and rhythm, no murmurs, rubs or gallops, PMI not laterally displaced GI- soft, NT, ND, + BS Extremities- no clubbing, cyanosis, or edema  MS- no significant deformity or atrophy Skin- no rash or lesion Psych- euthymic mood, full affect Neuro-  strength and sensation are intact    Labs:   Lab Results  Component Value Date   WBC 6.1 07/01/2014   HGB 15.8 07/01/2014   HCT 45.0 07/01/2014   MCV 89.6 07/01/2014   PLT 220 07/01/2014     Recent Labs Lab 07/01/14 0955  NA 142  K 4.0  CL 114*  CO2 25  BUN 11  CREATININE 0.97  CALCIUM 10.0  GLUCOSE 90   Lab Results  Component Value Date   TROPONINI 0.53* 07/01/2014    Radiology/Studies: Dg Chest Port 1 View 06/30/2014   CLINICAL DATA:  Acute onset of chest pain, palpitations and shortness of breath which began earlier today. Patient had a ventricular arrhythmia on EKG in the emergency department which was converted and the patient has received a bolus of amiodarone.  EXAM: PORTABLE CHEST - 1 VIEW  COMPARISON:  None.  FINDINGS: Cardiac silhouette mildly to moderately enlarged even allowing for the AP portable technique. Elevation of right hemidiaphragm with associated volume loss in the right lower lobe. Lungs otherwise clear. No localized airspace consolidation. No pleural effusions. No pneumothorax. Normal pulmonary vascularity. External pacing pads.  IMPRESSION: Cardiomegaly. Elevation of the right hemidiaphragm with likely chronic scar or atelectasis in the right lower lobe. No acute cardiopulmonary disease otherwise.   Electronically Signed   By: Hulan Saas M.D.   On: 06/30/2014 16:15    EKG from 06/30/14 reveals LBB L superior axis wide complex tachycardia at 213 bpm EKG from 07/02/14 reveas sinus rhythm with PVCs (similar morphology to clinical tachycardia) with nonspecific ST/T changes and prolonged QT  TELEMETRY: sinus rhythm with PVC's   Echo- pending  A/P  1. VT The patient presented initially with symptomatic wide complex tachycardia.  The morphology is most consistent with VT arising from the RV.  I am concerned that he may have ARVD.  He denies FH of arrhythmia.  He does report having tachycardia about 9 years ago.  Echo is pending.  I will also order a  signal average EKG and cardiac MRI.  I do not appreciate an epsilon wave on his ekg.  If he is found to have ARVD then I would advise ICD implantation.  If he has no evidence of structural heart disease, then given the absence of syncope with his arrhythmia, aggressive beta blocker therapy would be advised without an ICD.   Given sustained arrhythmia, I would advise that he not drive for 6 months.  Further EP discussion based on results of Echo and MRI. This patient presents with a complex arrhythmias requiring a high degree of medical decision making.  2. HTN Stable No change required today  3. 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  Will obtain cardiac MRI for better evaluation of this. Hillis Range MD 07/02/2014 3:48 PM

## 2014-07-02 NOTE — Interval H&P Note (Signed)
Cath Lab Visit (complete for each Cath Lab visit)  Clinical Evaluation Leading to the Procedure:   ACS: Yes.    Non-ACS:    Anginal Classification: CCS I  Anti-ischemic medical therapy: Minimal Therapy (1 class of medications)  Non-Invasive Test Results: No non-invasive testing performed  Prior CABG: No previous CABG      History and Physical Interval Note:  07/02/2014 10:10 AM  James Fowler  has presented today for surgery, with the diagnosis of cp  The various methods of treatment have been discussed with the patient and family. After consideration of risks, benefits and other options for treatment, the patient has consented to  Procedure(s): LEFT HEART CATHETERIZATION WITH CORONARY ANGIOGRAM (N/A) as a surgical intervention .  The patient's history has been reviewed, patient examined, no change in status, stable for surgery.  I have reviewed the patient's chart and labs.  Questions were answered to the patient's satisfaction.     KELLY,THOMAS A

## 2014-07-02 NOTE — Progress Notes (Signed)
Consent for heart cath obtained and placed in chart

## 2014-07-02 NOTE — Progress Notes (Signed)
  Echocardiogram 2D Echocardiogram has been performed.  Leta Jungling M 07/02/2014, 3:28 PM

## 2014-07-02 NOTE — CV Procedure (Signed)
James Fowler is a 44 y.o. male   [id number]  [id number] LOCATION:  FACILITY: [facility]  PHYSICIAN: Lennette Bihari, MD, [facility] [birth year]   DATE OF PROCEDURE:  07/02/2014     CARDIAC CATHETERIZATION    HISTORY:   [name] is a 44 year old gentleman who developed wide-complex tachycardia at 213 bpm which was felt to be ventricular tachycardia.  He is referred for diagnostic coronary angiography prior to EP evaluation and possible ablation.   PROCEDURE: Heart catheterization via the right radial approach: Coronary angiography, left ventriculography.  The patient was brought to the second floor [facility] cath lab in the fasting state. The patient was premedicated with Versed 3 mg and fentanyl 50 mcg. A right radial approach was utilized after an Allen's test verified adequate circulation. The right radial artery was punctured via the Seldinger technique, and a 6 [place] Glidesheath Slender was inserted without difficulty.  A radial cocktail consisting of Verapamil, IV nitroglycerin, and lidocaine was administered. Weight adjusted heparin was administered. A safety J wire was advanced into the ascending aorta. Diagnostic catheterization was done with a 5 [place] TIG 4.0 catheter. A 5 French pigtail catheter was used for left ventriculography. A TR radial band was applied for hemostasis. The patient left the catheterization laboratory in stable condition.   HEMODYNAMICS:   Central Aorta: 105/70  Left Ventricle: 105/7  ANGIOGRAPHY:   The left main coronary artery was angiographically normal and bifurcated into the LAD and left circumflex coronary artery.   The LAD was angiographically normal and gave rise to 2 major diagonal vessels and several septal perforating arteries. The vessel extended to the LV apex.   The left circumflex coronary artery was angiographically normal and gave rise to one major bifurcating obtuse marginal branch.   The RCA was angiographically normal it gave  rise to a large PDA and PLA vessel.   Left ventriculography revealed normal global LV contractility without focal segmental wall motion abnormalities. There was no evidence for mitral regurgitation.  Ejection fraction is a approximately 55-60%.   Total contrast used: 70 cc Omipaque  IMPRESSION:  Normal left ventricular function.  Normal coronary arteries.   RECOMMENDATION:  The patient will undergo EP evaluation for his arrhythmia which is felt to be ventricular tachycardia.   Lennette Bihari, MD, [facility] 07/02/2014 10:47 AM

## 2014-07-03 ENCOUNTER — Encounter (HOSPITAL_COMMUNITY)
Admission: EM | Disposition: A | Discharge status: Home / Self Care | Payer: 59 | Source: Home / Self Care | Attending: Internal Medicine

## 2014-07-03 ENCOUNTER — Encounter (HOSPITAL_COMMUNITY): Payer: Self-pay | Admitting: Internal Medicine

## 2014-07-03 DIAGNOSIS — I471 Supraventricular tachycardia: Secondary | ICD-10-CM

## 2014-07-03 DIAGNOSIS — I429 Cardiomyopathy, unspecified: Secondary | ICD-10-CM

## 2014-07-03 HISTORY — PX: ELECTROPHYSIOLOGY STUDY: SHX5467

## 2014-07-03 SURGERY — ELECTROPHYSIOLOGY STUDY

## 2014-07-03 MED ORDER — HEPARIN (PORCINE) IN NACL 2-0.9 UNIT/ML-% IJ SOLN
INTRAMUSCULAR | Status: AC
  Filled 2014-07-03: qty 500

## 2014-07-03 MED ORDER — SODIUM CHLORIDE 0.9 % IJ SOLN
3.0000 mL | Freq: Two times a day (BID) | INTRAMUSCULAR | Status: DC
  Administered 2014-07-05: 3 mL via INTRAVENOUS

## 2014-07-03 MED ORDER — SODIUM CHLORIDE 0.9 % IV SOLN: INTRAVENOUS | Status: DC

## 2014-07-03 MED ORDER — DIAZEPAM 5 MG PO TABS: 10.0000 mg | ORAL_TABLET | ORAL | Status: DC

## 2014-07-03 MED ORDER — CHLORHEXIDINE GLUCONATE 4 % EX LIQD: 60.0000 mL | Freq: Once | CUTANEOUS | Status: DC

## 2014-07-03 MED ORDER — FENTANYL CITRATE 0.05 MG/ML IJ SOLN
INTRAMUSCULAR | Status: AC
  Filled 2014-07-03: qty 2

## 2014-07-03 MED ORDER — CEFAZOLIN SODIUM-DEXTROSE 2-3 GM-% IV SOLR: 2.0000 g | INTRAVENOUS | Status: DC

## 2014-07-03 MED ORDER — BUPIVACAINE HCL (PF) 0.25 % IJ SOLN
INTRAMUSCULAR | Status: AC
  Filled 2014-07-03: qty 30

## 2014-07-03 MED ORDER — MIDAZOLAM HCL 5 MG/5ML IJ SOLN
INTRAMUSCULAR | Status: AC
  Filled 2014-07-03: qty 5

## 2014-07-03 MED ORDER — ONDANSETRON HCL 4 MG/2ML IJ SOLN: 4.0000 mg | Freq: Four times a day (QID) | INTRAMUSCULAR | Status: DC | PRN

## 2014-07-03 MED ORDER — SODIUM CHLORIDE 0.9 % IR SOLN: 80.0000 mg | Status: DC

## 2014-07-03 MED ORDER — ACETAMINOPHEN 325 MG PO TABS: 650.0000 mg | ORAL_TABLET | ORAL | Status: DC | PRN

## 2014-07-03 MED ORDER — SODIUM CHLORIDE 0.9 % IV SOLN: 250.0000 mL | INTRAVENOUS | Status: DC | PRN

## 2014-07-03 MED ORDER — SODIUM CHLORIDE 0.9 % IJ SOLN: 3.0000 mL | INTRAMUSCULAR | Status: DC | PRN

## 2014-07-03 NOTE — Progress Notes (Signed)
SUBJECTIVE: The patient is doing well today.  At this time, he denies chest pain, shortness of breath, or any new concerns.  Cardiac MRI done yesterday - probably ARVC per Dr Meda Coffee - official read to be done this morning.   History reviewed   CURRENT MEDICATIONS: . aspirin EC  81 mg Oral Daily  . sodium chloride  3 mL Intravenous Q12H   . sodium chloride 20 mL/hr (07/01/14 1214)  . sodium chloride 90 mL/hr at 07/02/14 2004    OBJECTIVE: Physical Exam: Filed Vitals:   07/02/14 0430 07/02/14 0955 07/02/14 2049 07/03/14 0424  BP: 136/93  154/97 117/60  Pulse: 72 71 60 54  Temp: 98.2 F (36.8 C)  98 F (36.7 C) 97.4 F (36.3 C)  TempSrc: Oral  Oral Oral  Resp: '18  16 16  ' Height:      Weight: 199 lb 11.8 oz (90.6 kg)   198 lb 8 oz (90.039 kg)  SpO2: 99%  97% 100%    Intake/Output Summary (Last 24 hours) at 07/03/14 1145 Last data filed at 07/03/14 0400  Gross per 24 hour  Intake 1818.16 ml  Output      0 ml  Net 1818.16 ml    Telemetry reveals sinus bradycardia  Physical Exam: Well developed and nourished in no acute distress HENT normal Neck supple with JVP-flat Clear Regular rate and rhythm, no murmurs or gallops Abd-soft with active BS No Clubbing cyanosis edema Skin-warm and dry A & Oriented  Grossly normal sensory and motor function   LABS: Basic Metabolic Panel:  Recent Labs  06/30/14 1509 07/01/14 0955  NA 138 142  K 3.9 4.0  CL 103 114*  CO2 24 25  GLUCOSE 162* 90  BUN 13 11  CREATININE 1.23 0.97  CALCIUM 10.1 10.0  MG 2.1  --    CBC:  Recent Labs  06/30/14 1509 07/01/14 0955  WBC 9.6 6.1  NEUTROABS 5.3 3.4  HGB 17.0 15.8  HCT 48.4 45.0  MCV 89.8 89.6  PLT 248 220   Cardiac Enzymes:  Recent Labs  06/30/14 1509 07/01/14 0955  TROPONINI 0.07* 0.53*   Thyroid Function Tests:  Recent Labs  07/01/14 2040  TSH 3.867    RADIOLOGY: Dg Chest Port 1 View 06/30/2014   CLINICAL DATA:  Acute onset of chest pain,  palpitations and shortness of breath which began earlier today. Patient had a ventricular arrhythmia on EKG in the emergency department which was converted and the patient has received a bolus of amiodarone.  EXAM: PORTABLE CHEST - 1 VIEW  COMPARISON:  None.  FINDINGS: Cardiac silhouette mildly to moderately enlarged even allowing for the AP portable technique. Elevation of right hemidiaphragm with associated volume loss in the right lower lobe. Lungs otherwise clear. No localized airspace consolidation. No pleural effusions. No pneumothorax. Normal pulmonary vascularity. External pacing pads.  IMPRESSION: Cardiomegaly. Elevation of the right hemidiaphragm with likely chronic scar or atelectasis in the right lower lobe. No acute cardiopulmonary disease otherwise.   Electronically Signed   By: Evangeline Dakin M.D.   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

## 2014-07-03 NOTE — Progress Notes (Signed)
UR completed 

## 2014-07-03 NOTE — CV Procedure (Signed)
James Fowler [id number]  [id number]  Preop Dx:WCT Postop Dx same/ vnetricular tachycardia  Procedure: EPS  Cx: None   EBL: Minimal    Dictation number [id number]  Sherryl Manges, MD 07/03/2014 4:02 PM

## 2014-07-04 ENCOUNTER — Encounter (HOSPITAL_COMMUNITY)
Admission: EM | Disposition: A | Discharge status: Home / Self Care | Payer: Self-pay | Source: Home / Self Care | Attending: Internal Medicine

## 2014-07-04 ENCOUNTER — Encounter (HOSPITAL_COMMUNITY): Payer: Self-pay | Admitting: Internal Medicine

## 2014-07-04 DIAGNOSIS — I472 Ventricular tachycardia, unspecified: Secondary | ICD-10-CM

## 2014-07-04 DIAGNOSIS — I447 Left bundle-branch block, unspecified: Secondary | ICD-10-CM | POA: Diagnosis present

## 2014-07-04 DIAGNOSIS — E780 Pure hypercholesterolemia: Secondary | ICD-10-CM | POA: Diagnosis present

## 2014-07-04 DIAGNOSIS — I10 Essential (primary) hypertension: Secondary | ICD-10-CM | POA: Diagnosis present

## 2014-07-04 DIAGNOSIS — I429 Cardiomyopathy, unspecified: Secondary | ICD-10-CM | POA: Diagnosis present

## 2014-07-04 DIAGNOSIS — I4901 Ventricular fibrillation: Secondary | ICD-10-CM | POA: Diagnosis not present

## 2014-07-04 DIAGNOSIS — I493 Ventricular premature depolarization: Secondary | ICD-10-CM | POA: Diagnosis present

## 2014-07-04 DIAGNOSIS — Z95810 Presence of automatic (implantable) cardiac defibrillator: Secondary | ICD-10-CM | POA: Diagnosis not present

## 2014-07-04 DIAGNOSIS — E785 Hyperlipidemia, unspecified: Secondary | ICD-10-CM | POA: Diagnosis present

## 2014-07-04 HISTORY — PX: IMPLANTABLE CARDIOVERTER DEFIBRILLATOR IMPLANT: SHX5473

## 2014-07-04 SURGERY — IMPLANTABLE CARDIOVERTER DEFIBRILLATOR IMPLANT
Anesthesia: LOCAL

## 2014-07-04 MED ORDER — MIDAZOLAM HCL 5 MG/5ML IJ SOLN
INTRAMUSCULAR | Status: AC
  Filled 2014-07-04: qty 5

## 2014-07-04 MED ORDER — SODIUM CHLORIDE 0.9 % IV SOLN
INTRAVENOUS | Status: DC
  Administered 2014-07-04: 08:00:00 via INTRAVENOUS

## 2014-07-04 MED ORDER — ONDANSETRON HCL 4 MG/2ML IJ SOLN: 4.0000 mg | Freq: Four times a day (QID) | INTRAMUSCULAR | Status: DC | PRN

## 2014-07-04 MED ORDER — CEFAZOLIN SODIUM-DEXTROSE 2-3 GM-% IV SOLR
2.0000 g | INTRAVENOUS | Status: DC
  Filled 2014-07-04 (×2): qty 50

## 2014-07-04 MED ORDER — ACETAMINOPHEN 325 MG PO TABS
325.0000 mg | ORAL_TABLET | ORAL | Status: DC | PRN
  Administered 2014-07-04: 650 mg via ORAL
  Filled 2014-07-04: qty 2

## 2014-07-04 MED ORDER — CHLORHEXIDINE GLUCONATE 4 % EX LIQD
60.0000 mL | Freq: Once | CUTANEOUS | Status: AC
  Filled 2014-07-04: qty 60

## 2014-07-04 MED ORDER — SODIUM CHLORIDE 0.9 % IR SOLN
80.0000 mg | Status: DC
  Filled 2014-07-04: qty 2

## 2014-07-04 MED ORDER — FENTANYL CITRATE 0.05 MG/ML IJ SOLN
INTRAMUSCULAR | Status: AC
  Filled 2014-07-04: qty 2

## 2014-07-04 MED ORDER — CHLORHEXIDINE GLUCONATE 4 % EX LIQD
60.0000 mL | Freq: Once | CUTANEOUS | Status: AC
  Administered 2014-07-04: 4 via TOPICAL

## 2014-07-04 MED ORDER — LIDOCAINE HCL (PF) 1 % IJ SOLN
INTRAMUSCULAR | Status: AC
  Filled 2014-07-04: qty 60

## 2014-07-04 MED ORDER — CEFAZOLIN SODIUM 1-5 GM-% IV SOLN
1.0000 g | Freq: Four times a day (QID) | INTRAVENOUS | Status: AC
  Administered 2014-07-04 – 2014-07-05 (×3): 1 g via INTRAVENOUS
  Filled 2014-07-04 (×4): qty 50

## 2014-07-04 MED ORDER — HEPARIN (PORCINE) IN NACL 2-0.9 UNIT/ML-% IJ SOLN
INTRAMUSCULAR | Status: AC
  Filled 2014-07-04: qty 500

## 2014-07-04 NOTE — CV Procedure (Signed)
   SURGEON:  Lewayne Bunting, MD      PREPROCEDURE DIAGNOSES:   1. Hemodynamically unstable monomorphic VT  2. ARVD     POSTPROCEDURE DIAGNOSES:  Same as pre-procedure     PROCEDURES:    1. ICD implantation.  2. Left  upper extremity venography  3. Defibrillation threshold testing     INTRODUCTION:  James Fowler is a 44 y.o. male with ARVD and hemodynamically unstable VT. At this time, he meets criteria for ICD implant (AVID).  There is no reversible cause of his VT.  The patient therefore  presents today for ICD implantation.      DESCRIPTION OF PROCEDURE:  Informed written consent was obtained and the patient was brought to the electrophysiology lab in the fasting state. The patient was adequately sedated with intravenous Versed, and fentanyl as outlined in the nursing report.  The patient's left chest was prepped and draped in the usual sterile fashion by the EP lab staff.  The skin overlying the left deltopectoral region was infiltrated with lidocaine for local analgesia.  A 5-cm incision was made over the left deltopectoral region.  A left subcutaneous defibrillator pocket was fashioned using a combination of sharp and blunt dissection.  Electrocautery was used to assure hemostasis. Initiall attempts to puncture the right axillary vein were unsuccessful.   Left Upper extremity Venography:  A venogram of the left upper extremity was performed which revealed a moderate sized left axillary vein which emptied into a moderate sized left subclavian vein, both of which were inferiorly displaced.  RA/RV Lead Placement: The left axillary vein was cannulated with fluoroscopic visualization. Through the left axillary vein, a St. Jude [phone]  (serial # [id number]  ) right atrial lead and a St. Jude Durate (serial number [id number]) right ventricular defibrillator lead were advanced with fluoroscopic visualization into the right atrial appendage and right ventricular apical septal positions respectively.   Initial atrial lead P-waves measured 1.5 mV with an impedance of 562 ohms and a threshold of 1.1 volts at 0.5 milliseconds.  The right ventricular lead R-wave measured 13 mV with impedance of 796 ohms and a threshold of 0.8 volts at 0.5 milliseconds.   The leads were secured to the pectoralis  fascia using #2 silk suture over the suture sleeves.  The pocket then  irrigated with copious gentamicin solution.  The leads were then  connected to a St. Jude (serial  Number [id number]) ICD.  The defibrillator was placed into the  pocket.  The pocket was then closed in 2 layers with 2.0 Vicryl suture  for the subcutaneous and subcuticular layers.  Steri-Strips and a  sterile dressing were then applied.   DFT Testing: Defibrillation Threshold testing was then performed. Ventricular fibrillation was induced with a T shock.  Adequate sensing of ventricular fibrillation was observed with minimal dropout with a programmed sensitivity of 35mV.  The patient was successfully defibrillated to sinus rhythm with a single 20 joules shock delivered from the device with an impedance of 69 ohms in a duration of 7 seconds.  The patient remained in sinus rhythm thereafter.  There were no early apparent complications.      CONCLUSIONS:   1. 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 Successful ICD implantation.   3. DFT less than or equal to 20 joules.   4. No early apparent complications.   Lewayne Bunting, MD  2:16 PM 07/04/2014

## 2014-07-04 NOTE — Op Note (Signed)
James Fowler, James Fowler NO.:  [id number]  MEDICAL RECORD NO.:  [id number]  LOCATION:  3W24C                        FACILITY:  [facility]  PHYSICIAN:  Duke Salvia, MD, FACCDATE OF BIRTH:  05-Jan-1971  DATE OF PROCEDURE:  07/03/2014 DATE OF DISCHARGE:                              OPERATIVE REPORT   PREOPERATIVE DIAGNOSIS:  Wide-complex tachycardia.  POSTOPERATIVE DIAGNOSIS:  Ventricular tachycardia.  PROCEDURE:  Invasive electrophysiological study.  DESCRIPTION OF PROCEDURE:  Following obtaining informed consent, the patient was brought to electrophysiology laboratory and placed on the fluoroscopic table in supine position.  After routine prep and drape, cardiac catheterization was performed with local anesthesia and conscious sedation.  Noninvasive blood pressure monitoring and oxygen saturation monitoring were performed continuously throughout the procedure.  Following the procedure, the catheters were removed. Hemostasis was obtained, and the patient was transferred to the floor in stable condition.  CATHETERS:  A 5-French quadripolar catheter was inserted via the right femoral vein to the right ventricular apex.  A 6-French octapolar catheter was inserted via the right femoral vein to the coronary sinus.  The previously implanted RV catheter was placed across the AV junction to measure the His electrogram.  SURFACE LEADS:  I, AVF, and V1 were monitored continuously throughout the procedure.  Following insertion of the catheters, a stimulation protocol included single and double ventricular extrastimuli at paced cycle length of 350:500 milliseconds, and double and triple ventricular extrastimuli at paced cycle length of 350 and 500 milliseconds from the right ventricular apex.  END-TIDAL RESULTS:  End-tidal surface electrocardiogram: 1. Rhythm sinus; RR interval 986 milliseconds; PR interval 132     milliseconds; PR interval 187 milliseconds; QRS  duration 92     milliseconds; QT interval 426 milliseconds. 2. AV nodal conduction was associated with ventricular pacing. 3. No evidence of accessory pathway was identified. 4. Ventricular response programed stimulation.  The ventricular ERP at     500 milliseconds was 280 milliseconds and at 350 milliseconds was     250 milliseconds. 5. Arrhythmias induced; ventricular tachycardia was induced with a     left bundle superior axis morphology similar to the patient's     clinical wide-complex tachycardia.  It was terminated with     ventricular pacing at 240 milliseconds.  IMPRESSION: 1. Normal sinus function. 2. Normal atrial function. 3. Normal atrioventricular nodal function. 4. Normal His-Purkinje system function. 5. No accessory pathway. 6. Inducible ventricular tachycardia.  SUMMARY AND CONCLUSION:  The results of electrophysiological testing confirmed ventricular tachycardia as the mechanism of the patient's wide- complex tachycardia.  In the context of evidence of cardiomyopathic disease as manifested by an abnormal MRI and abnormal signal average ECG, ICD implantation will be recommended.     Duke Salvia, MD, [facility]     SCK/MEDQ  D:  07/03/2014  T:  07/04/2014  Job:  [id number]

## 2014-07-04 NOTE — Interval H&P Note (Signed)
History and Physical Interval Note:since last seen he has been diagnosed with ARVD and had inducible VT at EP testing. Will plan to proceed with ICD implant.  07/04/2014 1:07 PM  James Fowler  has presented today for surgery, with the diagnosis of VT, cardiomyopathy  The various methods of treatment have been discussed with the patient and family. After consideration of risks, benefits and other options for treatment, the patient has consented to  Procedure(s): IMPLANTABLE CARDIOVERTER DEFIBRILLATOR IMPLANT (N/A) as a surgical intervention .  The patient's history has been reviewed, patient examined, no change in status, stable for surgery.  I have reviewed the patient's chart and labs.  Questions were answered to the patient's satisfaction.     Lewayne Bunting, M.D.

## 2014-07-04 NOTE — Discharge Summary (Signed)
     ELECTROPHYSIOLOGY PROCEDURE DISCHARGE SUMMARY    Patient ID: James Fowler,  MRN: [id number], DOB/AGE: [birth year] 44 y.o.  Admit date: 07/01/2014 Discharge date: 07/05/2014  Primary Care Physician: Haze Rushing, MD Electrophysiologist: Ladona Ridgel  Primary Discharge Diagnosis:  ARVD with hemodynamically unstable VT status post ICD implant this admission  Secondary Discharge Diagnosis:  1.  Hypertension 2.  Hyperlipidemia  No Known Allergies   Procedures This Admission:  1.  Echocardiogram on 07-02-14 demonstrated EF 55-60%, no RWMA 2.  Cardiac MRI on 07-03-14 demonstrated ARVD 3.  EPS on 07-03-14 demonstrated inducible sustained Monomorphic VT, hemodynamically unstable.  4.  Implantation of a STJ dual chamber ICD on 07-04-14 by Dr Ladona Ridgel.  See op note for full details.  DFT's were successful at 20 J.  There were no immediate post procedure complications. 5.  CXR on 07-05-14 demonstrated no pneumothorax status post device implantation.   Brief HPI/Hospital Course:  James Fowler is a 44 y.o. male with a past medical history of palpitations, hyperlipidemia, and hypertension. He reports extensive work up about 9 years ago at hospital in [place], but no records were found when release was faxed. He presented to the ER 06-30-14 with near syncope, palpitations, shortness of breath and chest pressure. He was found to be in a LBB L axis WCT at a rate of 213bpm. Just prior to cardioversion, his tachycardia became polymorphic and self terminated. Admission was recommended, but the patient left AMA. He then called his PCP who recommended inpatient evaluation and he again presented to the ER.Cardiac catheterization demonstrated no CAD with normal EF.Cardiac MRI demonstrated ARVD.  EPS demonstrated inducible VT.  The patient underwent implantation of a STJ dual chamber ICD with details as outlined above.   He was monitored on telemetry overnight which demonstrated sinus rhythm with occasional PVC's.   Left chest was without hematoma or ecchymosis.  The device was interrogated and found to be functioning normally.  CXR was obtained and demonstrated no pneumothorax status post device implantation.  Wound care, arm mobility, and restrictions were reviewed with the patient.  Dr Ladona Ridgel examined the patient and considered them stable for discharge to home.    Because of normal LV function and ARVD diagnosis, no ACE-I or BB is needed.   Discharge Vitals: Blood pressure 123/87, pulse 72, temperature 99.1 F (37.3 C), temperature source Oral, resp. rate 18, height 5\' 10"  (1.778 m), weight 195 lb 11.2 oz (88.769 kg), SpO2 96 %.   CV- RRR Lungs- clear Incision - no hematoma.  Labs:   Lab Results  Component Value Date   WBC 6.1 07/01/2014   HGB 15.8 07/01/2014   HCT 45.0 07/01/2014   MCV 89.6 07/01/2014   PLT 220 07/01/2014     Recent Labs Lab 07/01/14 0955  NA 142  K 4.0  CL 114*  CO2 25  BUN 11  CREATININE 0.97  CALCIUM 10.0  GLUCOSE 90   Lab Results  Component Value Date   TROPONINI 0.53* 07/01/2014     Discharge Medications:    Medication List    ASK your doctor about these medications        GOODY HEADACHE PO  Take 1 packet by mouth daily as needed (pain). For headache        Metoprolol 25 mg twice daily  Disposition: ok for discharge home.    Duration of Discharge Encounter: Greater than 30 minutes including physician time.  Signed,  Leonia Reeves.D.

## 2014-07-04 NOTE — H&P (View-Only) (Signed)
James Fowler is an 44 y.o. male.    Chief Complaint: palpitations and near syncope  HPI: The patient is a very pleasant 44 year old man whose history dates back 9 years when he presented to an outside hospital in [place] with palpitations. Workup at that time is uncertain. He did well until today when he presented after experiencing sudden onset of palpitations associated with near syncope. These episodes were associated with shortness of breath and chest pressure. He presented to the emergency room where he was in a wide QRS tachycardia at 213 bpm. This was a left bundle indeterminate axis tachycardia. It looks like ventricular tachycardia. Just as he was about to be cardioverted, he spontaneously reverted to sinus rhythm. Just prior to conversion, his tachycardia became polymorphic. He feels better. He denies shortness of breath or chest pressure at baseline. He is not limited in his activity. He has never had syncope and there is no family history of sudden cardiac death.  No past medical history on file. He has no significant past medical history with the exception of the episode of palpitations which occurred 9 years ago. No past surgical history on file. No prior surgeries No family history on file. Social History:  reports that he has never smoked. He does not have any smokeless tobacco history on file. He reports that he does not drink alcohol. His drug history is not on file.  Allergies: No Known Allergies Medications - he is on no medications  (Not in a hospital admission)  Results for orders placed or performed during the hospital encounter of 06/30/14 (from the past 48 hour(s))  Basic metabolic panel     Status: Abnormal   Collection Time: 06/30/14  3:09 PM  Result Value Ref Range   Sodium 138 135 - 145 mmol/L   Potassium 3.9 3.5 - 5.1 mmol/L   Chloride 103 96 - 112 mmol/L   CO2 24 19 - 32 mmol/L   Glucose, Bld 162 (H) 70 - 99 mg/dL   BUN 13 6 - 23 mg/dL   Creatinine, Ser  1.23 0.50 - 1.35 mg/dL   Calcium 10.1 8.4 - 10.5 mg/dL   GFR calc non Af Amer 70 (L) >90 mL/min   GFR calc Af Amer 82 (L) >90 mL/min    Comment: (NOTE) The eGFR has been calculated using the CKD EPI equation. This calculation has not been validated in all clinical situations. eGFR's persistently <90 mL/min signify possible Chronic Kidney Disease.    Anion gap 11 5 - 15  CBC with Differential/Platelet     Status: None   Collection Time: 06/30/14  3:09 PM  Result Value Ref Range   WBC 9.6 4.0 - 10.5 K/uL   RBC 5.39 4.22 - 5.81 MIL/uL   Hemoglobin 17.0 13.0 - 17.0 g/dL   HCT 48.4 39.0 - 52.0 %   MCV 89.8 78.0 - 100.0 fL   MCH 31.5 26.0 - 34.0 pg   MCHC 35.1 30.0 - 36.0 g/dL   RDW 13.4 11.5 - 15.5 %   Platelets 248 150 - 400 K/uL   Neutrophils Relative % 56 43 - 77 %   Neutro Abs 5.3 1.7 - 7.7 K/uL   Lymphocytes Relative 36 12 - 46 %   Lymphs Abs 3.5 0.7 - 4.0 K/uL   Monocytes Relative 6 3 - 12 %   Monocytes Absolute 0.6 0.1 - 1.0 K/uL   Eosinophils Relative 2 0 - 5 %   Eosinophils Absolute 0.2 0.0 - 0.7 K/uL  Basophils Relative 0 0 - 1 %   Basophils Absolute 0.0 0.0 - 0.1 K/uL  Troponin I     Status: Abnormal   Collection Time: 06/30/14  3:09 PM  Result Value Ref Range   Troponin I 0.07 (H) <0.031 ng/mL    Comment:        PERSISTENTLY INCREASED TROPONIN VALUES IN THE RANGE OF 0.04-0.49 ng/mL CAN BE SEEN IN:       -UNSTABLE ANGINA       -CONGESTIVE HEART FAILURE       -MYOCARDITIS       -CHEST TRAUMA       -ARRYHTHMIAS       -LATE PRESENTING MYOCARDIAL INFARCTION       -COPD   CLINICAL FOLLOW-UP RECOMMENDED.   Magnesium     Status: None   Collection Time: 06/30/14  3:09 PM  Result Value Ref Range   Magnesium 2.1 1.5 - 2.5 mg/dL  I-stat troponin, ED     Status: None   Collection Time: 06/30/14  3:40 PM  Result Value Ref Range   Troponin i, poc 0.04 0.00 - 0.08 ng/mL   Comment 3            Comment: Due to the release kinetics of cTnI, a negative result within  the first hours of the onset of symptoms does not rule out myocardial infarction with certainty. If myocardial infarction is still suspected, repeat the test at appropriate intervals.    Dg Chest Port 1 View  06/30/2014   CLINICAL DATA:  Acute onset of chest pain, palpitations and shortness of breath which began earlier today. Patient had a ventricular arrhythmia on EKG in the emergency department which was converted and the patient has received a bolus of amiodarone.  EXAM: PORTABLE CHEST - 1 VIEW  COMPARISON:  None.  FINDINGS: Cardiac silhouette mildly to moderately enlarged even allowing for the AP portable technique. Elevation of right hemidiaphragm with associated volume loss in the right lower lobe. Lungs otherwise clear. No localized airspace consolidation. No pleural effusions. No pneumothorax. Normal pulmonary vascularity. External pacing pads.  IMPRESSION: Cardiomegaly. Elevation of the right hemidiaphragm with likely chronic scar or atelectasis in the right lower lobe. No acute cardiopulmonary disease otherwise.   Electronically Signed   By: Evangeline Dakin M.D.   On: 06/30/2014 16:15    ROS  Blood pressure 113/85, pulse 87, temperature 98.3 F (36.8 C), temperature source Oral, resp. rate 25, height '5\' 10"'  (1.778 m), weight 200 lb (90.719 kg), SpO2 96 %. Physical Exam  Well appearing 44 year old man, NAD HEENT: Unremarkable,[place], AT Neck:  7 cm JVD, no thyromegally Back:  No CVA tenderness Lungs:  Clear with no wheezes, rales, or rhonchi HEART:  Regular rate rhythm, no murmurs, no rubs, no clicks Abd:  soft, positive bowel sounds, no organomegally, no rebound, no guarding Ext:  2 plus pulses, no edema, no cyanosis, no clubbing Skin:  No rashes no nodules Neuro:  CN II through XII intact, motor grossly intact  EKG: Left bundle-branch wide QRS tachycardia at 213 beats minute  Assessment/Plan 1. Probable ventricular tachycardia 2. Palpitations Discussion: The patient probably  has ventricular tachycardia which terminated spontaneously and was associated with hemodynamic instability. He needs cardiac catheterization and evaluation of left ventricular function. Additional recommendations including electrophysiologic testing will be forthcoming. I discussed the potential life-threatening nature of this problem with the patient and his wife who understand. He was initially started on amiodarone in the emergency room. This will be  discontinued. We will observe him in the ICU. He will be ruled out for MI.  Cristopher Peru 06/30/2014, 4:45 PM

## 2014-07-04 NOTE — H&P (Signed)
  ICD Criteria  Current LVEF:55% ;Obtained < 1 month ago.  NYHA Functional Classification: Class I  Heart Failure History:  No.  Non-Ischemic Dilated Cardiomyopathy History:  No.  Atrial Fibrillation/Atrial Flutter:  No.  Ventricular Tachycardia History:  Yes, Hemodynamic instability present, VT Type:  SVT - Monomorphic.  Cardiac Arrest History:  No  History of Syndromes with Risk of Sudden Death:  Yes, Other  Previous ICD:  No.  Electrophysiology Study: Yes, EP Study timeframe: < 1 month ago, Ventricular arrhythmias induced,  VT ablation NOT performed.  Prior MI: No.  PPM: No.  OSA:  No  Patient Life Expectancy of >=1 year: Yes.  Anticoagulation Therapy:  Patient is NOT on anticoagulation therapy.   Beta Blocker Therapy:  No, Reason not on Beta Blocker therapy: no LV dysfunction and no CAD  Ace Inhibitor/ARB Therapy:  No, Reason not on Ace Inhibitor/ARB therapy:  no LV dysfunction or CAD

## 2014-07-04 NOTE — Progress Notes (Signed)
Patient Name: James Fowler      SUBJECTIVE: without complaints  Past Medical History  Diagnosis Date  . Hyperlipidemia   . Hypertension     Scheduled Meds:  Scheduled Meds: . aspirin EC  81 mg Oral Daily  .  ceFAZolin (ANCEF) IV  2 g Intravenous On Call  . chlorhexidine  60 mL Topical Once  . chlorhexidine  60 mL Topical Once  . gentamicin irrigation  80 mg Irrigation On Call  . sodium chloride  3 mL Intravenous Q12H  . sodium chloride  3 mL Intravenous Q12H   Continuous Infusions: . sodium chloride 20 mL/hr (07/01/14 1214)  . sodium chloride 90 mL/hr at 07/02/14 2004  . sodium chloride 50 mL/hr at 07/04/14 0800   sodium chloride, sodium chloride, acetaminophen, nitroGLYCERIN, ondansetron (ZOFRAN) IV, sodium chloride, sodium chloride    PHYSICAL EXAM Filed Vitals:   07/03/14 1815 07/03/14 2016 07/04/14 0039 07/04/14 0427  BP: 113/85 112/76 119/68 125/76  Pulse:   79 63  Temp:  98.2 F (36.8 C) 98.6 F (37 C) 98.4 F (36.9 C)  TempSrc:   Oral Oral  Resp:   18 18  Height:      Weight:    196 lb 14.4 oz (89.313 kg)  SpO2:   98% 96%    Well developed and nourished in no acute distress HENT normal Neck supple with JVP-flat Clear Regular rate and rhythm, no murmurs or gallops Abd-soft with active BS No Clubbing cyanosis edema Skin-warm and dry A & Oriented  Grossly normal sensory and motor function   TELEMETRY: Reviewed telemetry pt in nsr:    Intake/Output Summary (Last 24 hours) at 07/04/14 0802 Last data filed at 07/04/14 0300  Gross per 24 hour  Intake    240 ml  Output    450 ml  Net   -210 ml    LABS: Basic Metabolic Panel:  Recent Labs Lab 06/30/14 1509 07/01/14 0955  NA 138 142  K 3.9 4.0  CL 103 114*  CO2 24 25  GLUCOSE 162* 90  BUN 13 11  CREATININE 1.23 0.97  CALCIUM 10.1 10.0  MG 2.1  --    Cardiac Enzymes:  Recent Labs  07/01/14 0955  TROPONINI 0.53*   CBC:  Recent Labs Lab 06/30/14 1509  07/01/14 0955  WBC 9.6 6.1  NEUTROABS 5.3 3.4  HGB 17.0 15.8  HCT 48.4 45.0  MCV 89.8 89.6  PLT 248 220   PROTIME:  Recent Labs  07/02/14 0426  LABPROT 12.6  INR 0.94   Liver Function Tests: No results for input(s): AST, ALT, ALKPHOS, BILITOT, PROT, ALBUMIN in the last 72 hours. No results for input(s): LIPASE, AMYLASE in the last 72 hours. BNP: BNP (last 3 results) No results for input(s): BNP in the last 8760 hours.  ProBNP (last 3 results) No results for input(s): PROBNP in the last 8760 hours.  D-Dimer: No results for input(s): DDIMER in the last 72 hours. Hemoglobin A1C: No results for input(s): HGBA1C in the last 72 hours. Fasting Lipid Panel: No results for input(s): CHOL, HDL, LDLCALC, TRIG, CHOLHDL, LDLDIRECT in the last 72 hours. 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

## 2014-07-04 NOTE — Progress Notes (Signed)
UR completed 

## 2014-07-05 ENCOUNTER — Inpatient Hospital Stay (HOSPITAL_COMMUNITY): Payer: 59

## 2014-07-05 ENCOUNTER — Encounter (HOSPITAL_COMMUNITY): Payer: Self-pay | Admitting: Nurse Practitioner

## 2014-07-05 MED ORDER — METOPROLOL TARTRATE 25 MG PO TABS: 25.0000 mg | ORAL_TABLET | Freq: Two times a day (BID) | ORAL | Status: DC

## 2014-07-05 NOTE — Progress Notes (Signed)
Pt is ready for DC home accompanied by family. Pt reports he understands all DC instructions, follow up appointments, and medications.   Torie Heron Pitcock, RN  

## 2014-07-05 NOTE — Discharge Instructions (Signed)
**  PLEASE REMEMBER TO BRING ALL OF YOUR MEDICATIONS TO EACH OF YOUR FOLLOW-UP OFFICE VISITS.     Supplemental Discharge Instructions for  Pacemaker/Defibrillator Patients  Activity No heavy lifting or vigorous activity with your left/right arm for 6 to 8 weeks.  Do not raise your left/right arm above your head for one week.  Gradually raise your affected arm as drawn below.           __     2.16               /       2.17             /         2.18          /          2.19       NO DRIVING for     ; you may begin driving on 0.30.09   (**NO DRIVING FOR 6 MONTHS**).  WOUND CARE - Keep the wound area clean and dry.  Do not get this area wet for one week. No showers for one week; you may shower on  2.19   . - The tape/steri-strips on your wound will fall off; do not pull them off.  No bandage is needed on the site.  DO  NOT apply any creams, oils, or ointments to the wound area. - If you notice any drainage or discharge from the wound, any swelling or bruising at the site, or you develop a fever > 101? F after you are discharged home, call the office at once.  Special Instructions - You are still able to use cellular telephones; use the ear opposite the side where you have your pacemaker/defibrillator.  Avoid carrying your cellular phone near your device. - When traveling through airports, show security personnel your identification card to avoid being screened in the metal detectors.  Ask the security personnel to use the hand wand. - Avoid arc welding equipment, MRI testing (magnetic resonance imaging), TENS units (transcutaneous nerve stimulators).  Call the office for questions about other devices. - Avoid electrical appliances that are in poor condition or are not properly grounded. - Microwave ovens are safe to be near or to operate.  Additional information for defibrillator patients should your device go off: - If your device goes off ONCE and you feel fine afterward, notify the  device clinic nurses. - If your device goes off ONCE and you do not feel well afterward, call 911. - If your device goes off TWICE, call 911. - If your device goes off THREE times in one day, call 911.  DO NOT DRIVE YOURSELF OR A FAMILY MEMBER WITH A DEFIBRILLATOR TO THE HOSPITAL--CALL 911.

## 2014-07-08 ENCOUNTER — Telehealth: Payer: Self-pay | Admitting: Internal Medicine

## 2014-07-08 NOTE — Telephone Encounter (Signed)
New message     Pt had a defibulator put in last Friday----when can he return to work?

## 2014-07-09 NOTE — Telephone Encounter (Signed)
May return to work on 07/15/14 per Dr Ladona Ridgel  Keep wound check appointment for tomorrow

## 2014-07-10 ENCOUNTER — Ambulatory Visit (INDEPENDENT_AMBULATORY_CARE_PROVIDER_SITE_OTHER): Payer: 59 | Admitting: *Deleted

## 2014-07-10 ENCOUNTER — Encounter: Payer: Self-pay | Admitting: *Deleted

## 2014-07-10 DIAGNOSIS — I472 Ventricular tachycardia, unspecified: Secondary | ICD-10-CM

## 2014-07-10 LAB — MDC_IDC_ENUM_SESS_TYPE_INCLINIC
Battery Remaining Longevity: 99.6 mo
Brady Statistic RA Percent Paced: 0.08 %
Brady Statistic RV Percent Paced: 0.01 %
Date Time Interrogation Session: 20160217094424
HIGH POWER IMPEDANCE MEASURED VALUE: 58.5 Ohm
Implantable Pulse Generator Serial Number: 7230141
Lead Channel Impedance Value: 475 Ohm
Lead Channel Impedance Value: 600 Ohm
Lead Channel Pacing Threshold Amplitude: 0.75 V
Lead Channel Pacing Threshold Pulse Width: 0.5 ms
Lead Channel Sensing Intrinsic Amplitude: 11.7 mV
Lead Channel Sensing Intrinsic Amplitude: 5 mV
Lead Channel Setting Pacing Amplitude: 3.5 V
Lead Channel Setting Pacing Pulse Width: 0.5 ms
Lead Channel Setting Sensing Sensitivity: 0.5 mV
MDC IDC MSMT LEADCHNL RV PACING THRESHOLD AMPLITUDE: 0.5 V
MDC IDC MSMT LEADCHNL RV PACING THRESHOLD AMPLITUDE: 0.5 V
MDC IDC MSMT LEADCHNL RV PACING THRESHOLD PULSEWIDTH: 0.5 ms
MDC IDC MSMT LEADCHNL RV PACING THRESHOLD PULSEWIDTH: 0.5 ms
MDC IDC PG MODEL: 2411
MDC IDC SET LEADCHNL RA PACING AMPLITUDE: 3.5 V
MDC IDC SET ZONE DETECTION INTERVAL: 270 ms
MDC IDC SET ZONE DETECTION INTERVAL: 315 ms

## 2014-07-10 NOTE — Progress Notes (Signed)
Wound check appointment. Steri-strips removed. Wound without redness or edema. Incision edges approximated, wound well healed. Normal device function. Thresholds, sensing, and impedances consistent with implant measurements. Device programmed at 3.5V for extra safety margin until 3 month visit. Histogram distribution appropriate for patient and level of activity. No mode switches or ventricular arrhythmias noted. Patient educated about wound care, arm mobility, lifting restrictions, shock plan. ROV in 3 months with GT. 

## 2014-07-17 ENCOUNTER — Ambulatory Visit: Payer: 59

## 2014-07-30 ENCOUNTER — Encounter: Payer: Self-pay | Admitting: Internal Medicine

## 2014-08-13 ENCOUNTER — Encounter: Payer: Self-pay | Admitting: *Deleted

## 2014-08-13 ENCOUNTER — Ambulatory Visit (INDEPENDENT_AMBULATORY_CARE_PROVIDER_SITE_OTHER): Payer: 59 | Admitting: *Deleted

## 2014-08-13 DIAGNOSIS — I472 Ventricular tachycardia, unspecified: Secondary | ICD-10-CM

## 2014-08-13 LAB — MDC_IDC_ENUM_SESS_TYPE_INCLINIC
Battery Remaining Longevity: 96 mo
Brady Statistic RA Percent Paced: 2.9 %
Brady Statistic RV Percent Paced: 0.01 %
HIGH POWER IMPEDANCE MEASURED VALUE: 72 Ohm
Lead Channel Impedance Value: 550 Ohm
Lead Channel Pacing Threshold Amplitude: 0.5 V
Lead Channel Pacing Threshold Amplitude: 0.75 V
Lead Channel Pacing Threshold Pulse Width: 0.5 ms
Lead Channel Sensing Intrinsic Amplitude: 10 mV
Lead Channel Sensing Intrinsic Amplitude: 5 mV
Lead Channel Setting Pacing Amplitude: 3.5 V
Lead Channel Setting Pacing Amplitude: 3.5 V
Lead Channel Setting Pacing Pulse Width: 0.5 ms
MDC IDC MSMT LEADCHNL RA IMPEDANCE VALUE: 462.5 Ohm
MDC IDC MSMT LEADCHNL RA PACING THRESHOLD PULSEWIDTH: 0.5 ms
MDC IDC MSMT LEADCHNL RV PACING THRESHOLD AMPLITUDE: 0.5 V
MDC IDC MSMT LEADCHNL RV PACING THRESHOLD PULSEWIDTH: 0.5 ms
MDC IDC PG MODEL: 2411
MDC IDC PG SERIAL: 7230141
MDC IDC SESS DTM: 20160322153015
MDC IDC SET LEADCHNL RV SENSING SENSITIVITY: 0.5 mV
MDC IDC SET ZONE DETECTION INTERVAL: 270 ms
Zone Setting Detection Interval: 315 ms

## 2014-08-13 NOTE — Progress Notes (Signed)
ICD check in clinic. Normal device function. Thresholds and sensing consistent with previous device measurements. Impedance trends stable over time. 6 VF episodes with 6 ATPs and 1 successful shock. 5 nst episodes. Pt was not taking Metoprolol 25mg  bid. Pt not happy with taking any kind of meds. Pt to think about starting meds. No mode switches. Histogram distribution appropriate for patient and level of activity. No changes made this session. Device programmed at appropriate safety margins. Device programmed to optimize intrinsic conduction. Estimated longevity 5.7 to 8.0 years. Pt enrolled in remote follow-up but not working at this time. Pt was given tech services number for [employer]. ROV in May with GT. GT reviewed.

## 2014-08-22 ENCOUNTER — Encounter: Payer: Self-pay | Admitting: Internal Medicine

## 2014-09-04 ENCOUNTER — Telehealth: Payer: Self-pay | Admitting: Internal Medicine

## 2014-09-04 NOTE — Telephone Encounter (Signed)
Pt c/o medication issue:  1. Name of Medication: Metoprolol   2. How are you currently taking this medication (dosage and times per day)? 25mg  2x per day   3. Are you having a reaction (difficulty breathing--STAT)? Stomach cramps, really weak, nausea, depression, runny nose, really sleepy  4. What is your medication issue? Pt calling with the above stated symptoms and either wants a sooner appt or wants to talk about decreasing dosage. Please call back and advise.

## 2014-09-04 NOTE — Telephone Encounter (Signed)
Spoke with pt and he states that the since he started the Metoprolol 25mg  BID he has been having nausea, severe stomach cramps, runny nose, has been weak and really sleepy. Pt states that yesterday and today he has only taken the AM dose and his symptoms have improved. Pt states that he has not checked his vitals but says that he has "felt fine in that manner" and that he hasn't felt his heart racing. Pt would like to decrease his dose. Informed pt that Dr. Ladona Ridgel was out of the office today but that I would route this information to him for review and advisement on medication. Pt verbalized understanding and was in agreement with this plan.

## 2014-09-05 NOTE — Telephone Encounter (Signed)
Spoke with patient and have asked he take 1/2 tablet twice daily for full coverage as it is a twice daily medication.  Let him know I would discuss with Dr Ladona Ridgel next week and call him with recommendations

## 2014-09-06 ENCOUNTER — Encounter: Payer: Self-pay | Admitting: Internal Medicine

## 2014-09-10 NOTE — Telephone Encounter (Signed)
Half tablet twice a day is fine.

## 2014-09-12 MED ORDER — METOPROLOL TARTRATE 25 MG PO TABS: 12.5000 mg | ORAL_TABLET | Freq: Two times a day (BID) | ORAL | Status: DC

## 2014-09-12 NOTE — Addendum Note (Signed)
Addended by: Dennis Bast F on: 09/12/2014 02:15 PM   Modules accepted: Orders

## 2014-09-12 NOTE — Telephone Encounter (Signed)
lmom that 1/2 tablet twice daily of the Metoprolol is okay with Dr Ladona Ridgel and to call back with other concerns if any

## 2014-10-08 ENCOUNTER — Encounter: Payer: Self-pay | Admitting: *Deleted

## 2014-10-08 ENCOUNTER — Encounter: Payer: Self-pay | Admitting: Internal Medicine

## 2014-10-08 ENCOUNTER — Ambulatory Visit (INDEPENDENT_AMBULATORY_CARE_PROVIDER_SITE_OTHER): Payer: 59 | Admitting: Internal Medicine

## 2014-10-08 VITALS — BP 114/78 | HR 61 | Ht 70.0 in | Wt 201.2 lb

## 2014-10-08 DIAGNOSIS — Z95810 Presence of automatic (implantable) cardiac defibrillator: Secondary | ICD-10-CM | POA: Insufficient documentation

## 2014-10-08 DIAGNOSIS — I429 Cardiomyopathy, unspecified: Secondary | ICD-10-CM

## 2014-10-08 DIAGNOSIS — I472 Ventricular tachycardia, unspecified: Secondary | ICD-10-CM

## 2014-10-08 LAB — CUP PACEART INCLINIC DEVICE CHECK
Battery Remaining Longevity: 96 mo
Brady Statistic RA Percent Paced: 6.8 %
Brady Statistic RV Percent Paced: 0.05 %
Date Time Interrogation Session: 20160517101454
HighPow Impedance: 67.5 Ohm
Lead Channel Impedance Value: 575 Ohm
Lead Channel Pacing Threshold Amplitude: 0.5 V
Lead Channel Sensing Intrinsic Amplitude: 11 mV
Lead Channel Sensing Intrinsic Amplitude: 5 mV
Lead Channel Setting Pacing Pulse Width: 0.5 ms
Lead Channel Setting Sensing Sensitivity: 0.5 mV
MDC IDC MSMT LEADCHNL RA IMPEDANCE VALUE: 450 Ohm
MDC IDC MSMT LEADCHNL RA PACING THRESHOLD AMPLITUDE: 0.5 V
MDC IDC MSMT LEADCHNL RA PACING THRESHOLD PULSEWIDTH: 0.5 ms
MDC IDC MSMT LEADCHNL RV PACING THRESHOLD PULSEWIDTH: 0.5 ms
MDC IDC SET LEADCHNL RA PACING AMPLITUDE: 2 V
MDC IDC SET LEADCHNL RV PACING AMPLITUDE: 2.5 V
Pulse Gen Model: 2411
Pulse Gen Serial Number: 7230141
Zone Setting Detection Interval: 270 ms
Zone Setting Detection Interval: 315 ms

## 2014-10-08 NOTE — Progress Notes (Signed)
      HPI James Fowler returns today for followup. He is a pleasant 44 yo man with ARVD diagnosed several months ago after presenting with VT. He had inducible VT and MRI confirming the diagnosis. He underwent ICD implant for secondary prevention. In the interim, he has done well. 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  No Known Allergies   Current Outpatient Prescriptions  Medication Sig Dispense Refill  . metoprolol tartrate (LOPRESSOR) 25 MG tablet Take 0.5 tablets (12.5 mg total) by mouth 2 (two) times daily. 60 tablet 6   No current facility-administered medications for this visit.     Past Medical History  Diagnosis Date  . Hyperlipidemia   . Hypertension   . VT (ventricular tachycardia)     a. 06/2014 ARVD s/p SJM dc AICD.  Marland Kitchen Arrhythmogenic right ventricular dysplasia     a. diagnosed by MRI 06/2014 following presentation with hemodynamically unstable VT    ROS:   All systems reviewed and negative except as noted in the HPI.   Past Surgical History  Procedure Laterality Date  . Arm surgery  1991    2 steel plates in right arm  . Left heart catheterization with coronary angiogram N/A 07/02/2014    no CAD, normal LV function  . Electrophysiology study N/A 07/03/2014    EPS by Dr Graciela Husbands with inducible VT  . Implantable cardioverter defibrillator implant N/A 07/04/2014    STJ dual chamber ICD implanted by Dr Ladona Ridgel for secondary prevention     Family History  Problem Relation Age of Onset  . Alcohol abuse Mother   . Mental illness Father     committed suicide     History   Social History  . Marital Status: Married    Spouse Name: N/A  . Number of Children: N/A  . Years of Education: N/A   Occupational History  . Not on file.   Social History Main Topics  . Smoking status: Never Smoker   . Smokeless tobacco: Not on file  . Alcohol Use: No  . Drug Use: No  . Sexual Activity: Not on file   Other Topics Concern  . Not on  file   Social History Narrative     BP 114/78 mmHg  Pulse 61  Ht 5\' 10"  (1.778 m)  Wt 201 lb 3.2 oz (91.264 kg)  BMI 28.87 kg/m2  SpO2 99%  Physical Exam:  Well appearing NAD HEENT: Unremarkable Neck:  No JVD, no thyromegally Lymphatics:  No adenopathy Back:  No CVA tenderness Lungs:  Clear with no wheezes, rales or rhonchi. Well healed ICD incision. HEART:  Regular rate rhythm, no murmurs, no rubs, no clicks Abd:  soft, positive bowel sounds, no organomegally, no rebound, no guarding Ext:  2 plus pulses, no edema, no cyanosis, no clubbing Skin:  No rashes no nodules Neuro:  CN II through XII intact, motor grossly intact   DEVICE  Normal device function.  See PaceArt for details.   Assess/Plan:

## 2014-10-08 NOTE — Assessment & Plan Note (Signed)
He has had no recurrent VT. He will continue his current meds.  

## 2014-10-08 NOTE — Assessment & Plan Note (Signed)
He is asymptomatic. He will undergo watchful waiting.

## 2014-10-08 NOTE — Assessment & Plan Note (Signed)
His St. Jude ICD is working normally. Will recheck in several months.  

## 2014-10-08 NOTE — Patient Instructions (Signed)
Medication Instructions:  Your physician recommends that you continue on your current medications as directed. Please refer to the Current Medication list given to you today.   Labwork: None ordered  Testing/Procedures: None ordered  Follow-Up: Your physician wants you to follow-up in: 9 months with Dr Court Joy will receive a reminder letter in the mail two months in advance. If you don't receive a letter, please call our office to schedule the follow-up appointment.  Remote monitoring is used to monitor your Pacemaker of ICD from home. This monitoring reduces the number of office visits required to check your device to one time per year. It allows [place] to keep an eye on the functioning of your device to ensure it is working properly. You are scheduled for a device check from home on 01/07/15. You may send your transmission at any time that day. If you have a wireless device, the transmission will be sent automatically. After your physician reviews your transmission, you will receive a postcard with your next transmission date.    Any Other Special Instructions Will Be Listed Below (If Applicable).

## 2015-01-01 ENCOUNTER — Telehealth: Payer: Self-pay | Admitting: Internal Medicine

## 2015-01-01 ENCOUNTER — Ambulatory Visit (INDEPENDENT_AMBULATORY_CARE_PROVIDER_SITE_OTHER): Payer: Commercial Managed Care - HMO | Admitting: *Deleted

## 2015-01-01 ENCOUNTER — Telehealth: Payer: Self-pay | Admitting: *Deleted

## 2015-01-01 ENCOUNTER — Encounter: Payer: Self-pay | Admitting: Internal Medicine

## 2015-01-01 VITALS — BP 119/73

## 2015-01-01 DIAGNOSIS — I472 Ventricular tachycardia, unspecified: Secondary | ICD-10-CM

## 2015-01-01 LAB — CUP PACEART INCLINIC DEVICE CHECK
Brady Statistic RV Percent Paced: 0.08 %
HighPow Impedance: 66 Ohm
Lead Channel Impedance Value: 437.5 Ohm
Lead Channel Impedance Value: 575 Ohm
Lead Channel Pacing Threshold Amplitude: 0.5 V
Lead Channel Pacing Threshold Amplitude: 0.5 V
Lead Channel Pacing Threshold Amplitude: 0.5 V
Lead Channel Pacing Threshold Pulse Width: 0.5 ms
Lead Channel Sensing Intrinsic Amplitude: 12 mV
Lead Channel Setting Pacing Amplitude: 2.5 V
Lead Channel Setting Pacing Pulse Width: 0.5 ms
Lead Channel Setting Sensing Sensitivity: 0.5 mV
MDC IDC MSMT BATTERY REMAINING LONGEVITY: 93.6 mo
MDC IDC MSMT LEADCHNL RA PACING THRESHOLD PULSEWIDTH: 0.5 ms
MDC IDC MSMT LEADCHNL RA SENSING INTR AMPL: 5 mV
MDC IDC MSMT LEADCHNL RV PACING THRESHOLD AMPLITUDE: 0.5 V
MDC IDC MSMT LEADCHNL RV PACING THRESHOLD PULSEWIDTH: 0.5 ms
MDC IDC MSMT LEADCHNL RV PACING THRESHOLD PULSEWIDTH: 0.5 ms
MDC IDC SESS DTM: 20160810105401
MDC IDC SET LEADCHNL RA PACING AMPLITUDE: 2 V
MDC IDC SET ZONE DETECTION INTERVAL: 315 ms
MDC IDC STAT BRADY RA PERCENT PACED: 5.1 %
Pulse Gen Model: 2411
Pulse Gen Serial Number: 7230141
Zone Setting Detection Interval: 270 ms

## 2015-01-01 MED ORDER — METOPROLOL TARTRATE 25 MG PO TABS: 25.0000 mg | ORAL_TABLET | Freq: Two times a day (BID) | ORAL | Status: DC

## 2015-01-01 NOTE — Telephone Encounter (Signed)
New message       1. Has your device fired? Yes----device shocked pt last night around 7pm 2. Is you device beeping? no 3. Are you experiencing draining or swelling at device site? no  4. Are you calling to see if we received your device transmission? no  5. Have you passed out? no

## 2015-01-01 NOTE — Progress Notes (Signed)
ICD check in clinic for ICD shock. Normal device function. Thresholds and sensing consistent with previous device measurements. Impedance trends stable over time. (2) treated "VF" episodes--most recent 12/31/14 x 23 sec @ 222bpm, (1) successful ICD shock---shock occurred after mowing the lawn, no sx's before or after shock per pt, patient states that he has been taking his Lopressor 12.5 BID, patient states that he has been well hydrated. (4) VT episodes---max dur. 15 sec, Max V 218---successful ATP x (4), (2) NSVT episodes---max dur. 4 sec. No mode switches. Stable thoracic imepedance. Histogram distribution appropriate for patient and level of activity. No changes made this session. Device programmed at appropriate safety margins. Device programmed to optimize intrinsic conduction. Estimated longevity 7.2-7.8 years. Pt enrolled in remote follow-up. Plan to follow up via [facility] on 11-9 and with GT in 06-2015. Patient education completed including shock plan. Vibration demonstrated for patient. Patient aware of 6 months driving restriction.

## 2015-01-01 NOTE — Telephone Encounter (Signed)
Patient to come into the office today for ICD interrogation. Patient is aware that he should not drive himself. Patient voiced understanding and will have wife bring him into the office.

## 2015-01-01 NOTE — Telephone Encounter (Signed)
Treated VT episodes reviewed with Gypsy Balsam, NP. Recommendations were to increase the Lopressor to 25 mg BID, then f/u with GT in 1-2 weeks. Informed patient of recommendations. Patients agreeable with plan. Will defer scheduling to [facility].

## 2015-01-21 ENCOUNTER — Encounter: Payer: Self-pay | Admitting: Internal Medicine

## 2015-01-21 ENCOUNTER — Ambulatory Visit (INDEPENDENT_AMBULATORY_CARE_PROVIDER_SITE_OTHER): Payer: Commercial Managed Care - HMO | Admitting: Internal Medicine

## 2015-01-21 VITALS — BP 114/90 | HR 70 | Ht 70.0 in | Wt 202.0 lb

## 2015-01-21 DIAGNOSIS — Z95810 Presence of automatic (implantable) cardiac defibrillator: Secondary | ICD-10-CM

## 2015-01-21 DIAGNOSIS — I429 Cardiomyopathy, unspecified: Secondary | ICD-10-CM | POA: Diagnosis not present

## 2015-01-21 DIAGNOSIS — I472 Ventricular tachycardia, unspecified: Secondary | ICD-10-CM

## 2015-01-21 NOTE — Assessment & Plan Note (Signed)
His St. Jude device is working normally. Will recheck in several months.  

## 2015-01-21 NOTE — Assessment & Plan Note (Signed)
He has had 2 episodes occurring while he was working in the yard. He will followup with [place] in several months. We discussed the possibility of changing medications but will hold off for now. He will continue metoprolol 25 mg twice daily. Would consider sotalol in the future for recurrent VT.

## 2015-01-21 NOTE — Patient Instructions (Signed)
Medication Instructions:  Your physician recommends that you continue on your current medications as directed. Please refer to the Current Medication list given to you today.   Labwork: None ordered  Testing/Procedures: None ordered  Follow-Up: Your physician recommends that you schedule a follow-up appointment in: 3 months with Dr Taylor   Any Other Special Instructions Will Be Listed Below (If Applicable).   

## 2015-01-21 NOTE — Progress Notes (Signed)
      HPI James Fowler returns today for followup. He is a pleasant 44 yo man with ARVD diagnosed several months ago after presenting with VT. He had inducible VT and MRI confirming the diagnosis. He underwent ICD implant for secondary prevention. In the interim, he has done well. He has gone back to work but is not driving. He has had one ICD shock which occurred several weeks ago. He had VT at 220/min. He denies medical non-compliance.   No Known Allergies   Current Outpatient Prescriptions  Medication Sig Dispense Refill  . metoprolol tartrate (LOPRESSOR) 25 MG tablet Take 1 tablet (25 mg total) by mouth 2 (two) times daily. 60 tablet 1   No current facility-administered medications for this visit.     Past Medical History  Diagnosis Date  . Hyperlipidemia   . Hypertension   . VT (ventricular tachycardia)     a. 06/2014 ARVD s/p SJM dc AICD.  Marland Kitchen Arrhythmogenic right ventricular dysplasia     a. diagnosed by MRI 06/2014 following presentation with hemodynamically unstable VT    ROS:   All systems reviewed and negative except as noted in the HPI.   Past Surgical History  Procedure Laterality Date  . Arm surgery  1991    2 steel plates in right arm  . Left heart catheterization with coronary angiogram N/A 07/02/2014    no CAD, normal LV function  . Electrophysiology study N/A 07/03/2014    EPS by Dr Graciela Husbands with inducible VT  . Implantable cardioverter defibrillator implant N/A 07/04/2014    STJ dual chamber ICD implanted by Dr Ladona Ridgel for secondary prevention     Family History  Problem Relation Age of Onset  . Alcohol abuse Mother   . Mental illness Father     committed suicide     Social History   Social History  . Marital Status: Married    Spouse Name: N/A  . Number of Children: N/A  . Years of Education: N/A   Occupational History  . Not on file.   Social History Main Topics  . Smoking status: Never Smoker   . Smokeless tobacco: Not on file  . Alcohol Use:  No  . Drug Use: No  . Sexual Activity: Not on file   Other Topics Concern  . Not on file   Social History Narrative     BP 114/90 mmHg  Pulse 70  Ht 5\' 10"  (1.778 m)  Wt 202 lb (91.627 kg)  BMI 28.98 kg/m2  Physical Exam:  Well appearing NAD HEENT: Unremarkable Neck:  7 cm JVD, no thyromegally Lymphatics:  No adenopathy Back:  No CVA tenderness Lungs:  Clear with no wheezes, rales or rhonchi. Well healed ICD incision. HEART:  Regular rate rhythm, no murmurs, no rubs, no clicks Abd:  soft, positive bowel sounds, no organomegally, no rebound, no guarding Ext:  2 plus pulses, no edema, no cyanosis, no clubbing Skin:  No rashes no nodules Neuro:  CN II through XII intact, motor grossly intact   DEVICE  Normal device function.  See PaceArt for details.   Assess/Plan:

## 2015-01-22 LAB — CUP PACEART INCLINIC DEVICE CHECK
Battery Remaining Longevity: 94.8 mo
Brady Statistic RA Percent Paced: 3.8 %
Brady Statistic RV Percent Paced: 0.06 %
HighPow Impedance: 82 Ohm
Lead Channel Impedance Value: 450 Ohm
Lead Channel Impedance Value: 600 Ohm
Lead Channel Pacing Threshold Amplitude: 0.5 V
Lead Channel Pacing Threshold Amplitude: 0.5 V
Lead Channel Pacing Threshold Pulse Width: 0.5 ms
Lead Channel Pacing Threshold Pulse Width: 0.5 ms
Lead Channel Sensing Intrinsic Amplitude: 5 mV
Lead Channel Setting Pacing Amplitude: 2 V
MDC IDC MSMT LEADCHNL RV SENSING INTR AMPL: 12 mV
MDC IDC SESS DTM: 20160830203335
MDC IDC SET LEADCHNL RV PACING AMPLITUDE: 2.5 V
MDC IDC SET LEADCHNL RV PACING PULSEWIDTH: 0.5 ms
MDC IDC SET LEADCHNL RV SENSING SENSITIVITY: 0.5 mV
MDC IDC SET ZONE DETECTION INTERVAL: 270 ms
Pulse Gen Model: 2411
Pulse Gen Serial Number: 7230141
Zone Setting Detection Interval: 315 ms

## 2015-04-24 ENCOUNTER — Encounter: Payer: Self-pay | Admitting: Internal Medicine

## 2015-04-24 ENCOUNTER — Ambulatory Visit (INDEPENDENT_AMBULATORY_CARE_PROVIDER_SITE_OTHER): Payer: Commercial Managed Care - HMO | Admitting: Internal Medicine

## 2015-04-24 VITALS — BP 118/78 | HR 62 | Ht 70.0 in | Wt 200.2 lb

## 2015-04-24 DIAGNOSIS — Z95810 Presence of automatic (implantable) cardiac defibrillator: Secondary | ICD-10-CM

## 2015-04-24 DIAGNOSIS — I472 Ventricular tachycardia, unspecified: Secondary | ICD-10-CM

## 2015-04-24 LAB — CUP PACEART INCLINIC DEVICE CHECK
Battery Remaining Longevity: 91.2
Brady Statistic RA Percent Paced: 5.4 %
Brady Statistic RV Percent Paced: 0.08 %
Date Time Interrogation Session: 20161201172144
HIGH POWER IMPEDANCE MEASURED VALUE: 75.375
Implantable Lead Implant Date: 20160211
Implantable Lead Implant Date: 20160211
Implantable Lead Location: 753859
Implantable Lead Model: 7122
Lead Channel Impedance Value: 425 Ohm
Lead Channel Pacing Threshold Amplitude: 0.5 V
Lead Channel Pacing Threshold Pulse Width: 0.5 ms
Lead Channel Pacing Threshold Pulse Width: 0.5 ms
Lead Channel Sensing Intrinsic Amplitude: 5 mV
Lead Channel Setting Pacing Amplitude: 2 V
Lead Channel Setting Pacing Amplitude: 2.5 V
MDC IDC LEAD LOCATION: 753860
MDC IDC MSMT LEADCHNL RV IMPEDANCE VALUE: 575 Ohm
MDC IDC MSMT LEADCHNL RV PACING THRESHOLD AMPLITUDE: 0.5 V
MDC IDC MSMT LEADCHNL RV SENSING INTR AMPL: 12 mV
MDC IDC SET LEADCHNL RV PACING PULSEWIDTH: 0.5 ms
MDC IDC SET LEADCHNL RV SENSING SENSITIVITY: 0.5 mV
Pulse Gen Serial Number: 7230141

## 2015-04-24 NOTE — Progress Notes (Signed)
      HPI James Fowler returns today for followup. He is a pleasant 44 yo man with ARVD diagnosed several months ago after presenting with VT. He had inducible VT and MRI confirming the diagnosis. He underwent ICD implant for secondary prevention. In the interim, he has done well. He has gone back to work but is not driving. He has had no additional shocks since his last visit.  He denies medical non-compliance.    No Known Allergies   Current Outpatient Prescriptions  Medication Sig Dispense Refill  . gabapentin (NEURONTIN) 100 MG capsule Take 200 mg by mouth daily.  2  . metoprolol tartrate (LOPRESSOR) 25 MG tablet Take 1 tablet (25 mg total) by mouth 2 (two) times daily. 60 tablet 1   No current facility-administered medications for this visit.     Past Medical History  Diagnosis Date  . Hyperlipidemia   . Hypertension   . VT (ventricular tachycardia) (HCC)     a. 06/2014 ARVD s/p SJM dc AICD.  Marland Kitchen Arrhythmogenic right ventricular dysplasia (HCC)     a. diagnosed by MRI 06/2014 following presentation with hemodynamically unstable VT    ROS:   All systems reviewed and negative except as noted in the HPI.   Past Surgical History  Procedure Laterality Date  . Arm surgery  1991    2 steel plates in right arm  . Left heart catheterization with coronary angiogram N/A 07/02/2014    no CAD, normal LV function  . Electrophysiology study N/A 07/03/2014    EPS by Dr Graciela Husbands with inducible VT  . Implantable cardioverter defibrillator implant N/A 07/04/2014    STJ dual chamber ICD implanted by Dr Ladona Ridgel for secondary prevention     Family History  Problem Relation Age of Onset  . Alcohol abuse Mother   . Mental illness Father     committed suicide     Social History   Social History  . Marital Status: Married    Spouse Name: N/A  . Number of Children: N/A  . Years of Education: N/A   Occupational History  . Not on file.   Social History Main Topics  . Smoking status:  Never Smoker   . Smokeless tobacco: Not on file  . Alcohol Use: No  . Drug Use: No  . Sexual Activity: Not on file   Other Topics Concern  . Not on file   Social History Narrative     BP 118/78 mmHg  Pulse 62  Ht 5\' 10"  (1.778 m)  Wt 200 lb 3.2 oz (90.81 kg)  BMI 28.73 kg/m2  Physical Exam:  Well appearing NAD HEENT: Unremarkable Neck:  7 cm JVD, no thyromegally Lymphatics:  No adenopathy Back:  No CVA tenderness Lungs:  Clear with no wheezes, rales or rhonchi. Well healed ICD incision. HEART:  Regular rate rhythm, no murmurs, no rubs, no clicks Abd:  soft, positive bowel sounds, no organomegally, no rebound, no guarding Ext:  2 plus pulses, no edema, no cyanosis, no clubbing Skin:  No rashes no nodules Neuro:  CN II through XII intact, motor grossly intact   DEVICE  Normal device function.  See PaceArt for details.   Assess/Plan:

## 2015-04-24 NOTE — Patient Instructions (Signed)
Medication Instructions: - no changes  Labwork: - none  Procedures/Testing: - none  Follow-Up: - Remote monitoring is used to monitor your Pacemaker of ICD from home. This monitoring reduces the number of office visits required to check your device to one time per year. It allows us to keep an eye on the functioning of your device to ensure it is working properly. You are scheduled for a device check from home on 07/24/15. You may send your transmission at any time that day. If you have a wireless device, the transmission will be sent automatically. After your physician reviews your transmission, you will receive a postcard with your next transmission date.  - Your physician wants you to follow-up in: 1 year with Dr. Taylor. You will receive a reminder letter in the mail two months in advance. If you don't receive a letter, please call our office to schedule the follow-up appointment.  Any Additional Special Instructions Will Be Listed Below (If Applicable).   

## 2015-04-24 NOTE — Assessment & Plan Note (Signed)
His St. Jude ICD is working normally. Will recheck in several months.  

## 2015-04-24 NOTE — Assessment & Plan Note (Signed)
Since his last visit, he has had no recurrent ICD therapies. He is still not driving. No change in meds. Will follow.

## 2015-04-30 ENCOUNTER — Telehealth: Payer: Self-pay | Admitting: *Deleted

## 2015-04-30 NOTE — Telephone Encounter (Signed)
LMTCB//sss 

## 2015-05-01 NOTE — Telephone Encounter (Signed)
Informed patient about episode on 12/4 where ATP was delivered. Patient denies having any sx's during the time of this episodes. Patient states that he had taken all of his medications as scheduled.  Will inform Dr.Taylor and notify patient if there are any further recommendations.  Patient aware of 6 months driving restriction.

## 2015-05-01 NOTE — Telephone Encounter (Signed)
Follow up ° ° ° ° °Returning a call to Shakila °

## 2015-05-22 ENCOUNTER — Encounter: Payer: Self-pay | Admitting: Internal Medicine

## 2015-06-13 ENCOUNTER — Other Ambulatory Visit: Payer: Self-pay | Admitting: *Deleted

## 2015-06-13 MED ORDER — METOPROLOL TARTRATE 25 MG PO TABS: 25.0000 mg | ORAL_TABLET | Freq: Two times a day (BID) | ORAL | Status: DC

## 2015-06-14 NOTE — H&P (Signed)
TOTAL HIP ADMISSION H&P  Patient is admitted for right total hip arthroplasty, anterior approach.  Subjective:  Chief Complaint:  Right hip AVN / pain  HPI: James Fowler, 45 y.o. male, has a history of pain and functional disability in the right hip(s) due to arthritis and AVN and patient has failed non-surgical conservative treatments for greater than 12 weeks to include NSAID's and/or analgesics and activity modification.  Onset of symptoms was gradual starting years ago with gradually worsening course since that time.The patient noted no past surgery on the right hip(s).  Patient currently rates pain in the right hip at 9 out of 10 with activity. Patient has worsening of pain with activity and weight bearing, trendelenberg gait, pain that interfers with activities of daily living and pain with passive range of motion. Patient has evidence of periarticular osteophytes, joint space narrowing and AVN by imaging studies. This condition presents safety issues increasing the risk of falls.  There is no current active infection.   Risks, benefits and expectations were discussed with the patient.  Risks including but not limited to the risk of anesthesia, blood clots, nerve damage, blood vessel damage, failure of the prosthesis, infection and up to and including death.  Patient understand the risks, benefits and expectations and wishes to proceed with surgery.   PCP: Haze Rushing, MD  D/C Plans:      Home outpatient  Post-op Meds:       No Rx given   Tranexamic Acid:      To be given - IV  Decadron:      Is to be given  FYI:     ASA  Norco    Patient Active Problem List   Diagnosis Date Noted  . ICD (implantable cardioverter-defibrillator) in place 10/08/2014  . VT (ventricular tachycardia) (HCC) 07/04/2014  . 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 Ventricular tachycardia (HCC) 06/30/2014   Past Medical History  Diagnosis Date  . Hyperlipidemia   .  Hypertension   . VT (ventricular tachycardia) (HCC)     a. 06/2014 ARVD s/p SJM dc AICD.  Marland Kitchen Arrhythmogenic right ventricular dysplasia (HCC)     a. diagnosed by MRI 06/2014 following presentation with hemodynamically unstable VT    Past Surgical History  Procedure Laterality Date  . Arm surgery  1991    2 steel plates in right arm  . Left heart catheterization with coronary angiogram N/A 07/02/2014    no CAD, normal LV function  . Electrophysiology study N/A 07/03/2014    EPS by Dr Graciela Husbands with inducible VT  . Implantable cardioverter defibrillator implant N/A 07/04/2014    STJ dual chamber ICD implanted by Dr Ladona Ridgel for secondary prevention    No prescriptions prior to admission   No Known Allergies   Social History  Substance Use Topics  . Smoking status: Never Smoker   . Smokeless tobacco: Not on file  . Alcohol Use: No    Family History  Problem Relation Age of Onset  . Alcohol abuse Mother   . Mental illness Father     committed suicide     Review of Systems  Constitutional: Negative.   HENT: Negative.   Eyes: Negative.   Respiratory: Negative.   Cardiovascular: Negative.   Gastrointestinal: Negative.   Genitourinary: Negative.   Musculoskeletal: Positive for joint pain.  Skin: Negative.   Neurological: Negative.   Endo/Heme/Allergies: Negative.   Psychiatric/Behavioral: Negative.     Objective:  Physical Exam  Constitutional:  He is oriented to person, place, and time. He appears well-developed.  HENT:  Head: Normocephalic.  Eyes: Pupils are equal, round, and reactive to light.  Neck: Neck supple. No JVD present. No tracheal deviation present. No thyromegaly present.  Cardiovascular: Normal rate, regular rhythm, normal heart sounds and intact distal pulses.   Respiratory: Effort normal and breath sounds normal. No stridor. No respiratory distress. He has no wheezes.  GI: Soft. There is no tenderness. There is no guarding.  Musculoskeletal:       Right hip: He  exhibits decreased range of motion, decreased strength, tenderness and bony tenderness. He exhibits no swelling, no deformity and no laceration.  Lymphadenopathy:    He has no cervical adenopathy.  Neurological: He is alert and oriented to person, place, and time.  Skin: Skin is warm and dry.  Psychiatric: He has a normal mood and affect.       Labs:  Estimated body mass index is 28.73 kg/(m^2) as calculated from the following:   Height as of 04/24/15: 5\' 10"  (1.778 m).   Weight as of 04/24/15: 90.81 kg (200 lb 3.2 oz).   Imaging Review Plain radiographs demonstrate AVN and degenerative joint disease of the right hip(s). The bone quality appears to be good for age and reported activity level.  Assessment/Plan:  End stage arthritis, right hip(s)  The patient history, physical examination, clinical judgement of the provider and imaging studies are consistent with end stage degenerative joint disease of the right hip(s) and total hip arthroplasty is deemed medically necessary. The treatment options including medical management, injection therapy, arthroscopy and arthroplasty were discussed at length. The risks and benefits of total hip arthroplasty were presented and reviewed. The risks due to aseptic loosening, infection, stiffness, dislocation/subluxation,  thromboembolic complications and other imponderables were discussed.  The patient acknowledged the explanation, agreed to proceed with the plan and consent was signed. Patient is being admitted for inpatient treatment for surgery, pain control, PT, OT, prophylactic antibiotics, VTE prophylaxis, progressive ambulation and ADL's and discharge planning.The patient is planning to be discharged home with home health services.      Anastasio Auerbach Levi Klaiber   PA-C  06/14/2015, 1:55 PM

## 2015-06-24 ENCOUNTER — Encounter (HOSPITAL_COMMUNITY)
Admission: RE | Admit: 2015-06-24 | Discharge: 2015-06-24 | Disposition: A | Discharge status: Home / Self Care | Payer: Commercial Managed Care - HMO | Source: Ambulatory Visit | Attending: Orthopedic Surgery | Admitting: Orthopedic Surgery

## 2015-06-24 ENCOUNTER — Encounter (HOSPITAL_COMMUNITY): Payer: Self-pay

## 2015-06-24 DIAGNOSIS — Z01812 Encounter for preprocedural laboratory examination: Secondary | ICD-10-CM | POA: Insufficient documentation

## 2015-06-24 DIAGNOSIS — M879 Osteonecrosis, unspecified: Secondary | ICD-10-CM | POA: Insufficient documentation

## 2015-06-24 HISTORY — DX: Myoneural disorder, unspecified: G70.9

## 2015-06-24 LAB — URINALYSIS, ROUTINE W REFLEX MICROSCOPIC
Bilirubin Urine: NEGATIVE
GLUCOSE, UA: NEGATIVE mg/dL
HGB URINE DIPSTICK: NEGATIVE
Ketones, ur: NEGATIVE mg/dL
LEUKOCYTES UA: NEGATIVE
Nitrite: NEGATIVE
PROTEIN: NEGATIVE mg/dL
SPECIFIC GRAVITY, URINE: 1.008 (ref 1.005–1.030)
pH: 5.5 (ref 5.0–8.0)

## 2015-06-24 LAB — CBC
HEMATOCRIT: 48 % (ref 39.0–52.0)
HEMOGLOBIN: 16.4 g/dL (ref 13.0–17.0)
MCH: 30.7 pg (ref 26.0–34.0)
MCHC: 34.2 g/dL (ref 30.0–36.0)
MCV: 89.9 fL (ref 78.0–100.0)
Platelets: 226 10*3/uL (ref 150–400)
RBC: 5.34 MIL/uL (ref 4.22–5.81)
RDW: 13.6 % (ref 11.5–15.5)
WBC: 6.5 10*3/uL (ref 4.0–10.5)

## 2015-06-24 LAB — BASIC METABOLIC PANEL
Anion gap: 6 (ref 5–15)
BUN: 13 mg/dL (ref 6–20)
CALCIUM: 10.3 mg/dL (ref 8.9–10.3)
CO2: 25 mmol/L (ref 22–32)
CREATININE: 0.91 mg/dL (ref 0.61–1.24)
Chloride: 105 mmol/L (ref 101–111)
GFR calc Af Amer: >60 mL/min (ref >60)
GLUCOSE: 86 mg/dL (ref 65–99)
Potassium: 4.4 mmol/L (ref 3.5–5.1)
Sodium: 136 mmol/L (ref 135–145)

## 2015-06-24 LAB — PROTIME-INR
INR: 1.04 (ref 0.00–1.49)
Prothrombin Time: 13.8 seconds (ref 11.6–15.2)

## 2015-06-24 LAB — APTT: APTT: 32 s (ref 24–37)

## 2015-06-24 LAB — SURGICAL PCR SCREEN
MRSA, PCR: NEGATIVE
Staphylococcus aureus: NEGATIVE

## 2015-06-24 NOTE — Pre-Procedure Instructions (Signed)
EKG 7'16 Epic. Echo 2--9-16 Epic Clearance note with chart -Dr. Rosette Reveal 06-03-15.

## 2015-06-24 NOTE — Patient Instructions (Addendum)
James Fowler  06/24/2015   Your procedure is scheduled on: 07-08-15   Report to [facility] Main  Entrance take [facility]  elevators to 3rd floor to  Short Stay Center at 1130 AM.  Call this number if you have problems the morning of surgery [phone]   Remember: ONLY 1 PERSON MAY GO WITH YOU TO SHORT STAY TO GET  READY MORNING OF YOUR SURGERY.  Do not eat food or drink liquids :After Midnight. Exception Clear liquids 12 midnight to 0800 AM 07-08-15, then nothing   CLEAR LIQUID DIET   Foods Allowed                                                                     Foods Excluded  Coffee and tea, regular and decaf                             liquids that you cannot  Plain Jell-O in any flavor                                             see through such as: Fruit ices (not with fruit pulp)                                     milk, soups, orange juice  Iced Popsicles                                    All solid food Carbonated beverages, regular and diet                                    Cranberry, grape and apple juices Sports drinks like Gatorade Lightly seasoned clear broth or consume(fat free) Sugar, honey syrup  Sample Menu Breakfast                                Lunch                                     Supper Cranberry juice                    Beef broth                            Chicken broth Jell-O                                     Grape juice  Apple juice Coffee or tea                        Jell-O                                      Popsicle                                                Coffee or tea                        Coffee or tea  _____________________________________________________________________       Take these medicines the morning of surgery with A SIP OF WATER: Metoprolol. Pain med -if need DO NOT TAKE ANY DIABETIC MEDICATIONS DAY OF YOUR SURGERY                               You may not have any  metal on your body including hair pins and              piercings  Do not wear jewelry, make-up, lotions, powders or perfumes, deodorant             Do not wear nail polish.  Do not shave  48 hours prior to surgery.              Men may shave face and neck.   Do not bring valuables to the hospital. [facility] IS NOT             RESPONSIBLE   FOR VALUABLES.  Contacts, dentures or bridgework may not be worn into surgery.  Leave suitcase in the car. After surgery it may be brought to your room.     Patients discharged the day of surgery will not be allowed to drive home.  Name and phone number of your driver:[name] -spouse [phone] cell  Special Instructions: N/A              Please read over the following fact sheets you were given: _____________________________________________________________________             [facility] - Preparing for Surgery Before surgery, you can play an important role.  Because skin is not sterile, your skin needs to be as free of germs as possible.  You can reduce the number of germs on your skin by washing with CHG (chlorahexidine gluconate) soap before surgery.  CHG is an antiseptic cleaner which kills germs and bonds with the skin to continue killing germs even after washing. Please DO NOT use if you have an allergy to CHG or antibacterial soaps.  If your skin becomes reddened/irritated stop using the CHG and inform your nurse when you arrive at Short Stay. Do not shave (including legs and underarms) for at least 48 hours prior to the first CHG shower.  You may shave your face/neck. Please follow these instructions carefully:  1.  Shower with CHG Soap the night before surgery and the  morning of Surgery.  2.  If you choose to wash your hair, wash your hair first as usual with your  normal  shampoo.  3.  After you shampoo, rinse your hair  and body thoroughly to remove the  shampoo.                           4.  Use CHG as you would any other liquid soap.   You can apply chg directly  to the skin and wash                       Gently with a scrungie or clean washcloth.  5.  Apply the CHG Soap to your body ONLY FROM THE NECK DOWN.   Do not use on face/ open                           Wound or open sores. Avoid contact with eyes, ears mouth and genitals (private parts).                       Wash face,  Genitals (private parts) with your normal soap.             6.  Wash thoroughly, paying special attention to the area where your surgery  will be performed.  7.  Thoroughly rinse your body with warm water from the neck down.  8.  DO NOT shower/wash with your normal soap after using and rinsing off  the CHG Soap.                9.  Pat yourself dry with a clean towel.            10.  Wear clean pajamas.            11.  Place clean sheets on your bed the night of your first shower and do not  sleep with pets. Day of Surgery : Do not apply any lotions/deodorants the morning of surgery.  Please wear clean clothes to the hospital/surgery center.  FAILURE TO FOLLOW THESE INSTRUCTIONS MAY RESULT IN THE CANCELLATION OF YOUR SURGERY PATIENT SIGNATURE_________________________________  NURSE SIGNATURE__________________________________  ________________________________________________________________________   James Fowler  An incentive spirometer is a tool that can help keep your lungs clear and active. This tool measures how well you are filling your lungs with each breath. Taking long deep breaths may help reverse or decrease the chance of developing breathing (pulmonary) problems (especially infection) following:  A long period of time when you are unable to move or be active. BEFORE THE PROCEDURE   If the spirometer includes an indicator to show your best effort, your nurse or respiratory therapist will set it to a desired goal.  If possible, sit up straight or lean slightly forward. Try not to slouch.  Hold the incentive spirometer in an  upright position. INSTRUCTIONS FOR USE   Sit on the edge of your bed if possible, or sit up as far as you can in bed or on a chair.  Hold the incentive spirometer in an upright position.  Breathe out normally.  Place the mouthpiece in your mouth and seal your lips tightly around it.  Breathe in slowly and as deeply as possible, raising the piston or the ball toward the top of the column.  Hold your breath for 3-5 seconds or for as long as possible. Allow the piston or ball to fall to the bottom of the column.  Remove the mouthpiece from your mouth and breathe out normally.  Rest for a few seconds and repeat Steps  1 through 7 at least 10 times every 1-2 hours when you are awake. Take your time and take a few normal breaths between deep breaths.  The spirometer may include an indicator to show your best effort. Use the indicator as a goal to work toward during each repetition.  After each set of 10 deep breaths, practice coughing to be sure your lungs are clear. If you have an incision (the cut made at the time of surgery), support your incision when coughing by placing a pillow or rolled up towels firmly against it. Once you are able to get out of bed, walk around indoors and cough well. You may stop using the incentive spirometer when instructed by your caregiver.  RISKS AND COMPLICATIONS  Take your time so you do not get dizzy or light-headed.  If you are in pain, you may need to take or ask for pain medication before doing incentive spirometry. It is harder to take a deep breath if you are having pain. AFTER USE  Rest and breathe slowly and easily.  It can be helpful to keep track of a log of your progress. Your caregiver can provide you with a simple table to help with this. If you are using the spirometer at home, follow these instructions: SEEK MEDICAL CARE IF:   You are having difficultly using the spirometer.  You have trouble using the spirometer as often as  instructed.  Your pain medication is not giving enough relief while using the spirometer.  You develop fever of 100.5 F (38.1 C) or higher. SEEK IMMEDIATE MEDICAL CARE IF:   You cough up bloody sputum that had not been present before.  You develop fever of 102 F (38.9 C) or greater.  You develop worsening pain at or near the incision site. MAKE SURE YOU:   Understand these instructions.  Will watch your condition.  Will get help right away if you are not doing well or get worse. Document Released: 09/20/2006 Document Revised: 08/02/2011 Document Reviewed: 11/21/2006 Exit[employer] Patient Information 2014 [employer], [place].   ________________________________________________________________________  WHAT IS A BLOOD TRANSFUSION? Blood Transfusion Information  A transfusion is the replacement of blood or some of its parts. Blood is made up of multiple cells which provide different functions.  Red blood cells carry oxygen and are used for blood loss replacement.  White blood cells fight against infection.  Platelets control bleeding.  Plasma helps clot blood.  Other blood products are available for specialized needs, such as hemophilia or other clotting disorders. BEFORE THE TRANSFUSION  Who gives blood for transfusions?   Healthy volunteers who are fully evaluated to make sure their blood is safe. This is blood bank blood. Transfusion therapy is the safest it has ever been in the practice of medicine. Before blood is taken from a donor, a complete history is taken to make sure that person has no history of diseases nor engages in risky social behavior (examples are intravenous drug use or sexual activity with multiple partners). The donor's travel history is screened to minimize risk of transmitting infections, such as malaria. The donated blood is tested for signs of infectious diseases, such as HIV and hepatitis. The blood is then tested to be sure it is compatible with you in  order to minimize the chance of a transfusion reaction. If you or a relative donates blood, this is often done in anticipation of surgery and is not appropriate for emergency situations. It takes many days to process the donated blood. RISKS  AND COMPLICATIONS Although transfusion therapy is very safe and saves many lives, the main dangers of transfusion include:   Getting an infectious disease.  Developing a transfusion reaction. This is an allergic reaction to something in the blood you were given. Every precaution is taken to prevent this. The decision to have a blood transfusion has been considered carefully by your caregiver before blood is given. Blood is not given unless the benefits outweigh the risks. AFTER THE TRANSFUSION  Right after receiving a blood transfusion, you will usually feel much better and more energetic. This is especially true if your red blood cells have gotten low (anemic). The transfusion raises the level of the red blood cells which carry oxygen, and this usually causes an energy increase.  The nurse administering the transfusion will monitor you carefully for complications. HOME CARE INSTRUCTIONS  No special instructions are needed after a transfusion. You may find your energy is better. Speak with your caregiver about any limitations on activity for underlying diseases you may have. SEEK MEDICAL CARE IF:   Your condition is not improving after your transfusion.  You develop redness or irritation at the intravenous (IV) site. SEEK IMMEDIATE MEDICAL CARE IF:  Any of the following symptoms occur over the next 12 hours:  Shaking chills.  You have a temperature by mouth above 102 F (38.9 C), not controlled by medicine.  Chest, back, or muscle pain.  People around you feel you are not acting correctly or are confused.  Shortness of breath or difficulty breathing.  Dizziness and fainting.  You get a rash or develop hives.  You have a decrease in urine  output.  Your urine turns a dark color or changes to pink, red, or brown. Any of the following symptoms occur over the next 10 days:  You have a temperature by mouth above 102 F (38.9 C), not controlled by medicine.  Shortness of breath.  Weakness after normal activity.  The white part of the eye turns yellow (jaundice).  You have a decrease in the amount of urine or are urinating less often.  Your urine turns a dark color or changes to pink, red, or brown. Document Released: 05/07/2000 Document Revised: 08/02/2011 Document Reviewed: 12/25/2007 [facility] Patient Information 2014 [place], [place].  _______________________________________________________________________

## 2015-06-29 ENCOUNTER — Encounter: Payer: Self-pay | Admitting: Internal Medicine

## 2015-06-30 ENCOUNTER — Telehealth: Payer: Self-pay | Admitting: *Deleted

## 2015-06-30 NOTE — Telephone Encounter (Signed)
LMTCB/sss  Alert printed for VT-1 episodes from 06/28/15.

## 2015-06-30 NOTE — Telephone Encounter (Signed)
Spoke to patient about (6) VT-1 episodes from 06/28/15. Patient denied having any sx's of CP, dizziness, ShOB, or syncope during the time of the episodes. Patient states that he had taken all of his medications as Rx'd.   Patient aware of driving restriction x 6 months.  Will inform Dr.Allred about episodes and call patient with any further recommendations.

## 2015-07-01 ENCOUNTER — Telehealth: Payer: Self-pay | Admitting: *Deleted

## 2015-07-01 ENCOUNTER — Encounter: Payer: Self-pay | Admitting: Internal Medicine

## 2015-07-01 NOTE — Telephone Encounter (Signed)
Patient had another VT-1 episode treated with ATP yesterday. Patient denies any sx's.  Will inform MD and notify patient of any further recommendations.

## 2015-07-01 NOTE — Telephone Encounter (Signed)
LMTCB//sss 

## 2015-07-08 ENCOUNTER — Inpatient Hospital Stay (HOSPITAL_COMMUNITY)
Admission: RE | Admit: 2015-07-08 | Discharge: 2015-07-09 | DRG: 470 | Disposition: A | Discharge status: Home Health | Payer: Commercial Managed Care - HMO | Source: Ambulatory Visit | Attending: Orthopedic Surgery | Admitting: Orthopedic Surgery

## 2015-07-08 ENCOUNTER — Encounter (HOSPITAL_COMMUNITY)
Admission: RE | Disposition: A | Discharge status: Home Health | Payer: Self-pay | Source: Ambulatory Visit | Attending: Orthopedic Surgery

## 2015-07-08 ENCOUNTER — Inpatient Hospital Stay (HOSPITAL_COMMUNITY): Payer: Commercial Managed Care - HMO

## 2015-07-08 ENCOUNTER — Encounter (HOSPITAL_COMMUNITY): Payer: Self-pay | Admitting: *Deleted

## 2015-07-08 ENCOUNTER — Inpatient Hospital Stay (HOSPITAL_COMMUNITY): Payer: Commercial Managed Care - HMO | Admitting: Anesthesiology

## 2015-07-08 DIAGNOSIS — E663 Overweight: Secondary | ICD-10-CM | POA: Diagnosis present

## 2015-07-08 DIAGNOSIS — E785 Hyperlipidemia, unspecified: Secondary | ICD-10-CM | POA: Diagnosis present

## 2015-07-08 DIAGNOSIS — M879 Osteonecrosis, unspecified: Secondary | ICD-10-CM | POA: Diagnosis present

## 2015-07-08 DIAGNOSIS — Z95810 Presence of automatic (implantable) cardiac defibrillator: Secondary | ICD-10-CM

## 2015-07-08 DIAGNOSIS — M1611 Unilateral primary osteoarthritis, right hip: Principal | ICD-10-CM | POA: Diagnosis present

## 2015-07-08 DIAGNOSIS — Z6828 Body mass index (BMI) 28.0-28.9, adult: Secondary | ICD-10-CM | POA: Diagnosis not present

## 2015-07-08 DIAGNOSIS — M25551 Pain in right hip: Secondary | ICD-10-CM | POA: Diagnosis present

## 2015-07-08 DIAGNOSIS — Z01812 Encounter for preprocedural laboratory examination: Secondary | ICD-10-CM | POA: Diagnosis not present

## 2015-07-08 DIAGNOSIS — I472 Ventricular tachycardia: Secondary | ICD-10-CM | POA: Diagnosis present

## 2015-07-08 DIAGNOSIS — I428 Other cardiomyopathies: Secondary | ICD-10-CM | POA: Diagnosis present

## 2015-07-08 DIAGNOSIS — Z96649 Presence of unspecified artificial hip joint: Secondary | ICD-10-CM

## 2015-07-08 DIAGNOSIS — I10 Essential (primary) hypertension: Secondary | ICD-10-CM | POA: Diagnosis present

## 2015-07-08 HISTORY — PX: TOTAL HIP ARTHROPLASTY: SHX124

## 2015-07-08 LAB — TYPE AND SCREEN
ABO/RH(D): O NEG
Antibody Screen: NEGATIVE

## 2015-07-08 LAB — ABO/RH: ABO/RH(D): O NEG

## 2015-07-08 SURGERY — ARTHROPLASTY, HIP, TOTAL, ANTERIOR APPROACH
Anesthesia: Spinal | Site: Hip | Laterality: Right

## 2015-07-08 MED ORDER — METOPROLOL TARTRATE 25 MG PO TABS
25.0000 mg | ORAL_TABLET | Freq: Two times a day (BID) | ORAL | Status: DC
  Administered 2015-07-09: 25 mg via ORAL
  Filled 2015-07-08 (×4): qty 1

## 2015-07-08 MED ORDER — MIDAZOLAM HCL 5 MG/5ML IJ SOLN
INTRAMUSCULAR | Status: DC | PRN
  Administered 2015-07-08: 2 mg via INTRAVENOUS

## 2015-07-08 MED ORDER — PROPOFOL 10 MG/ML IV BOLUS
INTRAVENOUS | Status: AC
  Filled 2015-07-08: qty 20

## 2015-07-08 MED ORDER — MENTHOL 3 MG MT LOZG: 1.0000 | LOZENGE | OROMUCOSAL | Status: DC | PRN

## 2015-07-08 MED ORDER — BUPIVACAINE HCL (PF) 0.5 % IJ SOLN
INTRAMUSCULAR | Status: AC
  Filled 2015-07-08: qty 30

## 2015-07-08 MED ORDER — HYDROCODONE-ACETAMINOPHEN 7.5-325 MG PO TABS
1.0000 | ORAL_TABLET | ORAL | Status: DC
  Administered 2015-07-08: 1 via ORAL
  Administered 2015-07-09 (×2): 2 via ORAL
  Filled 2015-07-08: qty 2
  Filled 2015-07-08: qty 1
  Filled 2015-07-08: qty 2

## 2015-07-08 MED ORDER — METHOCARBAMOL 1000 MG/10ML IJ SOLN
500.0000 mg | Freq: Four times a day (QID) | INTRAVENOUS | Status: DC | PRN
  Administered 2015-07-08: 500 mg via INTRAVENOUS
  Filled 2015-07-08 (×2): qty 5

## 2015-07-08 MED ORDER — ONDANSETRON HCL 4 MG/2ML IJ SOLN: 4.0000 mg | Freq: Four times a day (QID) | INTRAMUSCULAR | Status: DC | PRN

## 2015-07-08 MED ORDER — METOCLOPRAMIDE HCL 5 MG/ML IJ SOLN: 5.0000 mg | Freq: Three times a day (TID) | INTRAMUSCULAR | Status: DC | PRN

## 2015-07-08 MED ORDER — CEFAZOLIN SODIUM-DEXTROSE 2-3 GM-% IV SOLR
2.0000 g | Freq: Four times a day (QID) | INTRAVENOUS | Status: AC
  Administered 2015-07-08 – 2015-07-09 (×2): 2 g via INTRAVENOUS
  Filled 2015-07-08 (×2): qty 50

## 2015-07-08 MED ORDER — FERROUS SULFATE 325 (65 FE) MG PO TABS
325.0000 mg | ORAL_TABLET | Freq: Three times a day (TID) | ORAL | Status: DC
  Administered 2015-07-09: 325 mg via ORAL
  Filled 2015-07-08 (×4): qty 1

## 2015-07-08 MED ORDER — PROPOFOL 500 MG/50ML IV EMUL
INTRAVENOUS | Status: DC | PRN
  Administered 2015-07-08: 100 ug/kg/min via INTRAVENOUS

## 2015-07-08 MED ORDER — CEFAZOLIN SODIUM-DEXTROSE 2-3 GM-% IV SOLR
2.0000 g | INTRAVENOUS | Status: AC
  Administered 2015-07-08: 2 g via INTRAVENOUS

## 2015-07-08 MED ORDER — METHOCARBAMOL 500 MG PO TABS: 500.0000 mg | ORAL_TABLET | Freq: Four times a day (QID) | ORAL | Status: DC | PRN

## 2015-07-08 MED ORDER — DOCUSATE SODIUM 100 MG PO CAPS
100.0000 mg | ORAL_CAPSULE | Freq: Two times a day (BID) | ORAL | Status: DC
  Administered 2015-07-08 – 2015-07-09 (×2): 100 mg via ORAL

## 2015-07-08 MED ORDER — MAGNESIUM CITRATE PO SOLN: 1.0000 | Freq: Once | ORAL | Status: DC | PRN

## 2015-07-08 MED ORDER — FENTANYL CITRATE (PF) 100 MCG/2ML IJ SOLN
INTRAMUSCULAR | Status: AC
  Filled 2015-07-08: qty 2

## 2015-07-08 MED ORDER — BISACODYL 10 MG RE SUPP: 10.0000 mg | Freq: Every day | RECTAL | Status: DC | PRN

## 2015-07-08 MED ORDER — PHENOL 1.4 % MT LIQD
1.0000 | OROMUCOSAL | Status: DC | PRN
  Filled 2015-07-08: qty 177

## 2015-07-08 MED ORDER — CHLORHEXIDINE GLUCONATE 4 % EX LIQD
60.0000 mL | Freq: Once | CUTANEOUS | Status: DC
Start: 2015-07-08 — End: 2015-07-08

## 2015-07-08 MED ORDER — TRANEXAMIC ACID 1000 MG/10ML IV SOLN
1000.0000 mg | Freq: Once | INTRAVENOUS | Status: AC
  Administered 2015-07-08: 1000 mg via INTRAVENOUS
  Filled 2015-07-08: qty 10

## 2015-07-08 MED ORDER — MEPERIDINE HCL 50 MG/ML IJ SOLN: 6.2500 mg | INTRAMUSCULAR | Status: DC | PRN

## 2015-07-08 MED ORDER — CEFAZOLIN SODIUM-DEXTROSE 2-3 GM-% IV SOLR
INTRAVENOUS | Status: AC
  Filled 2015-07-08: qty 50

## 2015-07-08 MED ORDER — ALUM & MAG HYDROXIDE-SIMETH 200-200-20 MG/5ML PO SUSP: 30.0000 mL | ORAL | Status: DC | PRN

## 2015-07-08 MED ORDER — DEXAMETHASONE SODIUM PHOSPHATE 10 MG/ML IJ SOLN
INTRAMUSCULAR | Status: AC
  Filled 2015-07-08: qty 1

## 2015-07-08 MED ORDER — SODIUM CHLORIDE 0.9 % IR SOLN
Status: DC | PRN
  Administered 2015-07-08: 1000 mL

## 2015-07-08 MED ORDER — LACTATED RINGERS IV SOLN
INTRAVENOUS | Status: DC | PRN
  Administered 2015-07-08 (×3): via INTRAVENOUS

## 2015-07-08 MED ORDER — MIDAZOLAM HCL 2 MG/2ML IJ SOLN
INTRAMUSCULAR | Status: AC
  Filled 2015-07-08: qty 2

## 2015-07-08 MED ORDER — HYDROMORPHONE HCL 1 MG/ML IJ SOLN: 0.5000 mg | INTRAMUSCULAR | Status: DC | PRN

## 2015-07-08 MED ORDER — FENTANYL CITRATE (PF) 100 MCG/2ML IJ SOLN
25.0000 ug | INTRAMUSCULAR | Status: DC | PRN
  Administered 2015-07-08 (×4): 50 ug via INTRAVENOUS

## 2015-07-08 MED ORDER — PROPOFOL 10 MG/ML IV BOLUS
INTRAVENOUS | Status: AC
  Filled 2015-07-08: qty 40

## 2015-07-08 MED ORDER — PROMETHAZINE HCL 25 MG/ML IJ SOLN: 6.2500 mg | INTRAMUSCULAR | Status: DC | PRN

## 2015-07-08 MED ORDER — ONDANSETRON HCL 4 MG/2ML IJ SOLN
INTRAMUSCULAR | Status: AC
  Filled 2015-07-08: qty 2

## 2015-07-08 MED ORDER — CELECOXIB 200 MG PO CAPS
200.0000 mg | ORAL_CAPSULE | Freq: Two times a day (BID) | ORAL | Status: DC
  Administered 2015-07-08 – 2015-07-09 (×2): 200 mg via ORAL
  Filled 2015-07-08 (×3): qty 1

## 2015-07-08 MED ORDER — ONDANSETRON HCL 4 MG/2ML IJ SOLN
INTRAMUSCULAR | Status: DC | PRN
  Administered 2015-07-08: 4 mg via INTRAVENOUS

## 2015-07-08 MED ORDER — SODIUM CHLORIDE 0.9 % IV SOLN
100.0000 mL/h | INTRAVENOUS | Status: DC
  Administered 2015-07-08: 100 mL/h via INTRAVENOUS
  Filled 2015-07-08 (×2): qty 1000

## 2015-07-08 MED ORDER — LACTATED RINGERS IV SOLN: INTRAVENOUS | Status: DC

## 2015-07-08 MED ORDER — DEXAMETHASONE SODIUM PHOSPHATE 10 MG/ML IJ SOLN
10.0000 mg | Freq: Once | INTRAMUSCULAR | Status: DC
  Filled 2015-07-08: qty 1

## 2015-07-08 MED ORDER — ONDANSETRON HCL 4 MG PO TABS: 4.0000 mg | ORAL_TABLET | Freq: Four times a day (QID) | ORAL | Status: DC | PRN

## 2015-07-08 MED ORDER — LACTATED RINGERS IV SOLN: INTRAVENOUS | Status: DC | PRN

## 2015-07-08 MED ORDER — RIVAROXABAN 10 MG PO TABS
10.0000 mg | ORAL_TABLET | ORAL | Status: DC
  Administered 2015-07-09: 10 mg via ORAL
  Filled 2015-07-08 (×2): qty 1

## 2015-07-08 MED ORDER — METOCLOPRAMIDE HCL 10 MG PO TABS: 5.0000 mg | ORAL_TABLET | Freq: Three times a day (TID) | ORAL | Status: DC | PRN

## 2015-07-08 MED ORDER — DIPHENHYDRAMINE HCL 25 MG PO CAPS: 25.0000 mg | ORAL_CAPSULE | Freq: Four times a day (QID) | ORAL | Status: DC | PRN

## 2015-07-08 MED ORDER — POLYETHYLENE GLYCOL 3350 17 G PO PACK
17.0000 g | PACK | Freq: Two times a day (BID) | ORAL | Status: DC
  Administered 2015-07-08 – 2015-07-09 (×2): 17 g via ORAL

## 2015-07-08 MED ORDER — DEXAMETHASONE SODIUM PHOSPHATE 10 MG/ML IJ SOLN
10.0000 mg | Freq: Once | INTRAMUSCULAR | Status: AC
  Administered 2015-07-08: 10 mg via INTRAVENOUS

## 2015-07-08 MED ORDER — BUPIVACAINE HCL (PF) 0.5 % IJ SOLN
INTRAMUSCULAR | Status: DC | PRN
Start: 2015-07-08 — End: 2015-07-08
  Administered 2015-07-08: 15 mg via INTRATHECAL

## 2015-07-08 MED ORDER — FENTANYL CITRATE (PF) 100 MCG/2ML IJ SOLN
INTRAMUSCULAR | Status: DC | PRN
  Administered 2015-07-08: 100 ug via INTRAVENOUS

## 2015-07-08 SURGICAL SUPPLY — 34 items
BAG DECANTER FOR FLEXI CONT (MISCELLANEOUS) IMPLANT
BAG ZIPLOCK 12X15 (MISCELLANEOUS) IMPLANT
CAPT HIP TOTAL 2 ×3 IMPLANT
CLOTH BEACON ORANGE TIMEOUT ST (SAFETY) ×3 IMPLANT
COVER PERINEAL POST (MISCELLANEOUS) ×3 IMPLANT
DRAPE STERI IOBAN 125X83 (DRAPES) ×3 IMPLANT
DRAPE U-SHAPE 47X51 STRL (DRAPES) ×6 IMPLANT
DRSG AQUACEL AG ADV 3.5X10 (GAUZE/BANDAGES/DRESSINGS) ×3 IMPLANT
DURAPREP 26ML APPLICATOR (WOUND CARE) ×3 IMPLANT
ELECT REM PT RETURN 15FT ADLT (MISCELLANEOUS) IMPLANT
ELECT REM PT RETURN 9FT ADLT (ELECTROSURGICAL) ×3
ELECTRODE REM PT RTRN 9FT ADLT (ELECTROSURGICAL) ×1 IMPLANT
GLOVE BIOGEL M 7.0 STRL (GLOVE) IMPLANT
GLOVE BIOGEL PI IND STRL 7.5 (GLOVE) ×1 IMPLANT
GLOVE BIOGEL PI IND STRL 8.5 (GLOVE) ×1 IMPLANT
GLOVE BIOGEL PI INDICATOR 7.5 (GLOVE) ×2
GLOVE BIOGEL PI INDICATOR 8.5 (GLOVE) ×2
GLOVE ECLIPSE 8.0 STRL XLNG CF (GLOVE) ×6 IMPLANT
GLOVE ORTHO TXT STRL SZ7.5 (GLOVE) ×3 IMPLANT
GOWN STRL REUS W/TWL LRG LVL3 (GOWN DISPOSABLE) ×3 IMPLANT
GOWN STRL REUS W/TWL XL LVL3 (GOWN DISPOSABLE) ×3 IMPLANT
HOLDER FOLEY CATH W/STRAP (MISCELLANEOUS) ×3 IMPLANT
LIQUID BAND (GAUZE/BANDAGES/DRESSINGS) ×3 IMPLANT
PACK ANTERIOR HIP CUSTOM (KITS) ×3 IMPLANT
SAW OSC TIP CART 19.5X105X1.3 (SAW) ×3 IMPLANT
SUT MNCRL AB 4-0 PS2 18 (SUTURE) ×3 IMPLANT
SUT VIC AB 1 CT1 36 (SUTURE) ×9 IMPLANT
SUT VIC AB 2-0 CT1 27 (SUTURE) ×4
SUT VIC AB 2-0 CT1 TAPERPNT 27 (SUTURE) ×2 IMPLANT
SUT VLOC 180 0 24IN GS25 (SUTURE) ×3 IMPLANT
TRAY FOLEY W/METER SILVER 14FR (SET/KITS/TRAYS/PACK) IMPLANT
TRAY FOLEY W/METER SILVER 16FR (SET/KITS/TRAYS/PACK) ×3 IMPLANT
WATER STERILE IRR 1500ML POUR (IV SOLUTION) ×3 IMPLANT
YANKAUER SUCT BULB TIP 10FT TU (MISCELLANEOUS) IMPLANT

## 2015-07-08 NOTE — Anesthesia Procedure Notes (Signed)
Spinal Patient location during procedure: OR Start time: 07/08/2015 2:54 PM End time: 07/08/2015 2:59 PM Staffing Resident/CRNA: Harle Stanford R Performed by: resident/CRNA  Preanesthetic Checklist Completed: patient identified, site marked, surgical consent, pre-op evaluation, timeout performed, IV checked, risks and benefits discussed and monitors and equipment checked Spinal Block Patient position: sitting Prep: Betadine Patient monitoring: heart rate, cardiac monitor, continuous pulse ox and blood pressure Approach: midline Location: L3-4 Injection technique: single-shot Needle Needle type: Sprotte  Needle gauge: 24 G Needle length: 10 cm Needle insertion depth: 7 cm Assessment Sensory level: T6 Additional Notes Timeout performed. Coags reviewed. SAB without difficulty.Marland KitchenSpinal kit date checked.

## 2015-07-08 NOTE — Anesthesia Preprocedure Evaluation (Addendum)
Anesthesia Evaluation  Patient identified by MRN, date of birth, ID band Patient awake    Reviewed: Allergy & Precautions, NPO status , Patient's Chart, lab work & pertinent test results  Airway Mallampati: II  TM Distance: >3 FB Neck ROM: Full    Dental no notable dental hx. (+) Edentulous Upper   Pulmonary former smoker,    Pulmonary exam normal breath sounds clear to auscultation       Cardiovascular hypertension, Pt. on medications Normal cardiovascular exam+ dysrhythmias Ventricular Tachycardia + Cardiac Defibrillator (has shocked him x2 in the past)  Rhythm:Regular Rate:Normal  Arrhythmogenic right ventricular dysplasia    Neuro/Psych negative neurological ROS  negative psych ROS   GI/Hepatic negative GI ROS, Neg liver ROS,   Endo/Other  negative endocrine ROS  Renal/GU negative Renal ROS  negative genitourinary   Musculoskeletal negative musculoskeletal ROS (+)   Abdominal   Peds negative pediatric ROS (+)  Hematology negative hematology ROS (+)   Anesthesia Other Findings   Reproductive/Obstetrics negative OB ROS                            Anesthesia Physical Anesthesia Plan  ASA: III  Anesthesia Plan: Spinal   Post-op Pain Management:    Induction:   Airway Management Planned: Simple Face Mask  Additional Equipment:   Intra-op Plan:   Post-operative Plan:   Informed Consent: I have reviewed the patients History and Physical, chart, labs and discussed the procedure including the risks, benefits and alternatives for the proposed anesthesia with the patient or authorized representative who has indicated his/her understanding and acceptance.   Dental advisory given  Plan Discussed with: CRNA  Anesthesia Plan Comments: (Magnet on AICD. Will apply pads to chest. Not anticoagulated. SAB)       Anesthesia Quick Evaluation

## 2015-07-08 NOTE — Op Note (Signed)
NAME:  James Fowler                ACCOUNT NO.: [id number]      MEDICAL RECORD NO.: [id number]      FACILITY:  [facility]      PHYSICIAN:  Durene Romans D  DATE OF BIRTH:  1970/10/13     DATE OF PROCEDURE:  07/08/2015                                 OPERATIVE REPORT         PREOPERATIVE DIAGNOSIS: Right  hip idiopathic avascular necrosis     POSTOPERATIVE DIAGNOSIS:  Right hip idiopathic avascular necrosis.      PROCEDURE:  Right total hip replacement through an anterior approach   utilizing DePuy THR system, component size 53mm pinnacle cup, a size 36+4 neutral   Altrex liner, a size 10 Hi Tri Lock stem with a 36+1.5 delta ceramic   ball.      SURGEON:  Madlyn Frankel. Charlann Boxer, M.D.      ASSISTANT:  Lanney Gins, PA      ANESTHESIA:  Spinal.      SPECIMENS:  None.      COMPLICATIONS:  None.      BLOOD LOSS:  400 cc     DRAINS:  None      INDICATION OF THE PROCEDURE:  James Fowler is a 45 y.o. male who had   presented to office for evaluation of right hip pain.  Radiographs revealed   progressive avascular changes tot he femoral head with collapse of the femoral head.  The patient had painful limited range of   motion significantly affecting their overall quality of life.  The patient was failing to    respond to conservative measures, and at this point was ready   to proceed with more definitive measures.  The patient has noted avascular changes to the femoral head with collapse of the articular surface, progressive problems and dysfunction   with regarding the hip prior to surgery.  Consent was obtained for   benefit of pain relief.  Specific risk of infection, DVT, component   failure, dislocation, need for revision surgery, as well discussion of   the anterior versus posterior approach were reviewed.  Consent was   obtained for benefit of anterior pain relief through an anterior   approach.      PROCEDURE IN DETAIL:  The patient was brought to  operative theater.   Once adequate anesthesia, preoperative antibiotics, 2gm of Ancef, 1 gm of Tranexamic Acid, and 10 mg of Decadron administered.   The patient was positioned supine on the OSI Hanna table.  Once adequate   padding of boney process was carried out, we had predraped out the hip, and  used fluoroscopy to confirm orientation of the pelvis and position.      The right hip was then prepped and draped from proximal iliac crest to   mid thigh with shower curtain technique.      Time-out was performed identifying the patient, planned procedure, and   extremity.     An incision was then made 2 cm distal and lateral to the   anterior superior iliac spine extending over the orientation of the   tensor fascia lata muscle and sharp dissection was carried down to the   fascia of the muscle and protractor placed in the soft tissues.  The fascia was then incised.  The muscle belly was identified and swept   laterally and retractor placed along the superior neck.  Following   cauterization of the circumflex vessels and removing some pericapsular   fat, a second cobra retractor was placed on the inferior neck.  A third   retractor was placed on the anterior acetabulum after elevating the   anterior rectus.  A L-capsulotomy was along the line of the   superior neck to the trochanteric fossa, then extended proximally and   distally.  Tag sutures were placed and the retractors were then placed   intracapsular.  We then identified the trochanteric fossa and   orientation of my neck cut, confirmed this radiographically   and then made a neck osteotomy with the femur on traction.  The femoral   head was removed without difficulty or complication.  Traction was let   off and retractors were placed posterior and anterior around the   acetabulum.      The labrum and foveal tissue were debrided.  I began reaming with a 47mm   reamer and reamed up to 53mm reamer with good bony bed  preparation and a 54mm   cup was chosen.  The final 54mm Pinnacle cup was then impacted under fluoroscopy  to confirm the depth of penetration and orientation with respect to   abduction.  A screw was placed followed by the hole eliminator.  The final   36+4 neutral Altrex liner was impacted with good visualized rim fit.  The cup was positioned anatomically within the acetabular portion of the pelvis.      At this point, the femur was rolled at 80 degrees.  Further capsule was   released off the inferior aspect of the femoral neck.  I then   released the superior capsule proximally.  The hook was placed laterally   along the femur and elevated manually and held in position with the bed   hook.  The leg was then extended and adducted with the leg rolled to 100   degrees of external rotation.  Once the proximal femur was fully   exposed, I used a box osteotome to set orientation.  I then began   broaching with the starting chili pepper broach and passed this by hand and then broached up to 10.  With the 10 broach in place I chose a high offset neck and did several trial reductions.  The offset was appropriate, leg lengths   appeared to be equal best matched with the +1.5 head ball, confirmed radiographically.   Given these findings, I went ahead and dislocated the hip, repositioned all   retractors and positioned the right hip in the extended and abducted position.  The final 10 Hi Tri Lock stem was   chosen and it was impacted down to the level of neck cut.  Based on this   and the trial reduction, a 36+1.5 delta ceramic ball was chosen and   impacted onto a clean and dry trunnion, and the hip was reduced.  The   hip had been irrigated throughout the case again at this point.  I did   reapproximate the superior capsular leaflet to the anterior leaflet   using #1 Vicryl.  The fascia of the   tensor fascia lata muscle was then reapproximated using #1 Vicryl and #0 V-lock sutures.  The    remaining wound was closed with 2-0 Vicryl and running 4-0 Monocryl.   The hip was  cleaned, dried, and dressed sterilely using Dermabond and   Aquacel dressing.  He was then brought   to recovery room in stable condition tolerating the procedure well.    Lanney Gins, PA-C was present for the entirety of the case involved from   preoperative positioning, perioperative retractor management, general   facilitation of the case, as well as primary wound closure as assistant.            Madlyn Frankel Charlann Boxer, M.D.        07/08/2015 4:33 PM

## 2015-07-08 NOTE — Discharge Instructions (Signed)

## 2015-07-08 NOTE — Transfer of Care (Signed)
Immediate Anesthesia Transfer of Care Note  Patient: James Fowler  Procedure(s) Performed: Procedure(s): RIGHT TOTAL HIP ARTHROPLASTY ANTERIOR APPROACH (Right)  Patient Location: PACU  Anesthesia Type:Spinal  Level of Consciousness: awake, alert , oriented and patient cooperative  Airway & Oxygen Therapy: Patient Spontanous Breathing and Patient connected to face mask oxygen  Post-op Assessment: Report given to RN and Post -op Vital signs reviewed and stable  Post vital signs: Reviewed and stable  Last Vitals:  Filed Vitals:   07/08/15 1107  BP: 139/90  Pulse: 47  Temp: 36.4 C  Resp: 16    Complications: No apparent anesthesia complications

## 2015-07-09 ENCOUNTER — Encounter (HOSPITAL_COMMUNITY): Payer: Self-pay | Admitting: Orthopedic Surgery

## 2015-07-09 DIAGNOSIS — E663 Overweight: Secondary | ICD-10-CM | POA: Diagnosis present

## 2015-07-09 LAB — CBC
HCT: 42.7 % (ref 39.0–52.0)
Hemoglobin: 14.3 g/dL (ref 13.0–17.0)
MCH: 30.2 pg (ref 26.0–34.0)
MCHC: 33.5 g/dL (ref 30.0–36.0)
MCV: 90.1 fL (ref 78.0–100.0)
PLATELETS: 206 10*3/uL (ref 150–400)
RBC: 4.74 MIL/uL (ref 4.22–5.81)
RDW: 13 % (ref 11.5–15.5)
WBC: 12.4 10*3/uL — AB (ref 4.0–10.5)

## 2015-07-09 LAB — BASIC METABOLIC PANEL
ANION GAP: 8 (ref 5–15)
BUN: 14 mg/dL (ref 6–20)
CALCIUM: 9.7 mg/dL (ref 8.9–10.3)
CO2: 22 mmol/L (ref 22–32)
Chloride: 106 mmol/L (ref 101–111)
Creatinine, Ser: 0.94 mg/dL (ref 0.61–1.24)
GFR calc Af Amer: >60 mL/min (ref >60)
GLUCOSE: 156 mg/dL — AB (ref 65–99)
POTASSIUM: 4.2 mmol/L (ref 3.5–5.1)
SODIUM: 136 mmol/L (ref 135–145)

## 2015-07-09 MED ORDER — DOCUSATE SODIUM 100 MG PO CAPS: 100.0000 mg | ORAL_CAPSULE | Freq: Two times a day (BID) | ORAL | Status: DC

## 2015-07-09 MED ORDER — FERROUS SULFATE 325 (65 FE) MG PO TABS: 325.0000 mg | ORAL_TABLET | Freq: Three times a day (TID) | ORAL | Status: DC

## 2015-07-09 MED ORDER — HYDROCODONE-ACETAMINOPHEN 7.5-325 MG PO TABS: 1.0000 | ORAL_TABLET | ORAL | Status: DC | PRN

## 2015-07-09 MED ORDER — METHOCARBAMOL 500 MG PO TABS: 500.0000 mg | ORAL_TABLET | Freq: Four times a day (QID) | ORAL | Status: DC | PRN

## 2015-07-09 MED ORDER — ASPIRIN EC 325 MG PO TBEC: 325.0000 mg | DELAYED_RELEASE_TABLET | Freq: Two times a day (BID) | ORAL | Status: AC

## 2015-07-09 MED ORDER — POLYETHYLENE GLYCOL 3350 17 G PO PACK: 17.0000 g | PACK | Freq: Two times a day (BID) | ORAL | Status: DC

## 2015-07-09 MED ORDER — RIVAROXABAN 10 MG PO TABS: 10.0000 mg | ORAL_TABLET | ORAL | Status: DC

## 2015-07-09 NOTE — Anesthesia Postprocedure Evaluation (Signed)
Anesthesia Post Note  Patient: James Fowler  Procedure(s) Performed: Procedure(s) (LRB): RIGHT TOTAL HIP ARTHROPLASTY ANTERIOR APPROACH (Right)  Patient location during evaluation: PACU Anesthesia Type: Spinal Level of consciousness: oriented and awake and alert Pain management: pain level controlled Vital Signs Assessment: post-procedure vital signs reviewed and stable Respiratory status: spontaneous breathing, respiratory function stable and patient connected to nasal cannula oxygen Cardiovascular status: blood pressure returned to baseline and stable Postop Assessment: no headache and no backache Anesthetic complications: no    Last Vitals:  Filed Vitals:   07/09/15 0104 07/09/15 0543  BP: 122/62 117/68  Pulse: 74 67  Temp: 37.1 C 38 C  Resp: 16 16    Last Pain:  Filed Vitals:   07/09/15 0936  PainSc: 4                  Phillips Grout

## 2015-07-09 NOTE — Progress Notes (Signed)
     Subjective: 1 Day Post-Op Procedure(s) (LRB): RIGHT TOTAL HIP ARTHROPLASTY ANTERIOR APPROACH (Right)   Patient reports pain as mild, pain controlled.  Little bit of a rough night do to pain, but much better now.  No events throughout the night. Ready to be discharged home.  Objective:   VITALS:   Filed Vitals:   07/09/15 0104 07/09/15 0543  BP: 122/62 117/68  Pulse: 74 67  Temp: 98.7 F (37.1 C) 100.4 F (38 C)  Resp: 16 16    Dorsiflexion/Plantar flexion intact Incision: dressing C/D/I No cellulitis present Compartment soft  LABS  Recent Labs  07/09/15 0411  HGB 14.3  HCT 42.7  WBC 12.4*  PLT 206     Recent Labs  07/09/15 0411  NA 136  K 4.2  BUN 14  CREATININE 0.94  GLUCOSE 156*     Assessment/Plan: 1 Day Post-Op Procedure(s) (LRB): RIGHT TOTAL HIP ARTHROPLASTY ANTERIOR APPROACH (Right) Foley cath d/c'ed Advance diet Up with therapy D/C IV fluids Discharge home with home health  Follow up in 2 weeks at [facility]. Follow up with OLIN,Jaelee Laughter D in 2 weeks.  Contact information:  [facility] [address], [address] [place] [address] [phone]    Overweight (BMI 25-29.9) Estimated body mass index is 28.7 kg/(m^2) as calculated from the following:   Height as of this encounter: 5\' 10"  (1.778 m).   Weight as of this encounter: 90.719 kg (200 lb). Patient also counseled that weight may inhibit the healing process Patient counseled that losing weight will help with future health issues      Anastasio Auerbach. Tasheena Wambolt   PAC  07/09/2015, 9:28 AM

## 2015-07-09 NOTE — Care Management Note (Signed)
Case Management Note  Patient Details  Name: Dinnis Janowiak MRN: [id number] Date of Birth: 1970-06-24  Subjective/Objective:    RTHA               Action/Plan: Discharge planning Expected Discharge Date:                  Expected Discharge Plan:  Home/Self Care  In-House Referral:     Discharge planning Services     Post Acute Care Choice:    Choice offered to:  Patient  DME Arranged:    DME Agency:     HH Arranged:    HH Agency:     Status of Service:     Medicare Important Message Given:    Date Medicare IM Given:    Medicare IM give by:    Date Additional Medicare IM Given:    Additional Medicare Important Message give by:     If discussed at Long Length of Stay Meetings, dates discussed:    Additional Comments: Per PT EVAL and RN, pt delcines HH services; CM called pt to confirm and pt states he does NOT want any HH services.  No other CM needs were communicated. Yves Dill, RN 07/09/2015, 12:16 PM

## 2015-07-09 NOTE — Evaluation (Signed)
Physical Therapy Evaluation Patient Details Name: James Fowler MRN: [id number] DOB: 24-Feb-1971 Today's Date: 07/09/2015   History of Present Illness  45 yo male s/p R THA-direct anterior 07/08/15. Hx of AVN  Clinical Impression  On eval, pt was supervision level assist for mobility-walked ~150 feet with RW. Pain minimal with activity. Practiced supine and standing exercises. Practiced ascending/descending 1 step. Issued HEP for pt to follow. No further questions from pt. All education completed. Ready to d/c from PT standpoint.     Follow Up Recommendations Outpatient PT (if MD feels it is warranted after follow up appt)    Equipment Recommendations  None recommended by PT (pt states he has access to all DME)    Recommendations for Other Services       Precautions / Restrictions Precautions Precautions: Fall Restrictions Weight Bearing Restrictions: No RLE Weight Bearing: Weight bearing as tolerated      Mobility  Bed Mobility Overal bed mobility: Needs Assistance Bed Mobility: Supine to Sit     Supine to sit: Supervision Sit to supine: Supervision   General bed mobility comments: Increased time.   Transfers Overall transfer level: Needs assistance Equipment used: Rolling walker (2 wheeled) Transfers: Sit to/from Stand Sit to Stand: Supervision         General transfer comment: VCs for hand placement  Ambulation/Gait Ambulation/Gait assistance: Supervision Ambulation Distance (Feet): 150 Feet Assistive device: Rolling walker (2 wheeled) Gait Pattern/deviations: Step-through pattern;Decreased stride length;Narrow base of support     General Gait Details: for safety. slow gait speed. VCS foot positioning, BOS.   Stairs Stairs: Yes Stairs assistance: Supervision Stair Management: Step to pattern;Forwards;With walker Number of Stairs: 1 General stair comments: VCS safety, sequence, technique.   Wheelchair Mobility    Modified Rankin (Stroke Patients  Only)       Balance                                             Pertinent Vitals/Pain Pain Assessment: 0-10 Pain Score: 2  Pain Location: R hip Pain Descriptors / Indicators: Sore Pain Intervention(s): Monitored during session;Ice applied;Repositioned    Home Living Family/patient expects to be discharged to:: Private residence Living Arrangements: Spouse/significant other Available Help at Discharge: Family Type of Home: House Home Access: Stairs to enter Entrance Stairs-Rails: None Entrance Stairs-Number of Steps: 1 Home Layout: One level Home Equipment: Environmental consultant - 2 wheels;Bedside commode      Prior Function Level of Independence: Independent               Hand Dominance        Extremity/Trunk Assessment   Upper Extremity Assessment: Defer to OT evaluation           Lower Extremity Assessment: RLE deficits/detail RLE Deficits / Details: strength at least 3/5 throughout    Cervical / Trunk Assessment: Normal  Communication   Communication: No difficulties  Cognition Arousal/Alertness: Awake/alert Behavior During Therapy: WFL for tasks assessed/performed Overall Cognitive Status: Within Functional Limits for tasks assessed                      General Comments      Exercises Total Joint Exercises Ankle Circles/Pumps: AROM;Both;10 reps;Supine Quad Sets: AROM;Both;10 reps;Supine Hip ABduction/ADduction: AROM;Right;10 reps;Supine;Standing Knee Flexion: AROM;Right;10 reps;Standing Marching in Standing: AROM;Both;10 reps;Standing      Assessment/Plan    PT Assessment All  further PT needs can be met in the next venue of care (OP PT follow up if MD feels it is warranted at follow up appt)  PT Diagnosis Acute pain;Difficulty walking   PT Problem List Decreased strength;Decreased range of motion;Decreased balance;Decreased mobility;Pain;Decreased knowledge of use of DME  PT Treatment Interventions     PT Goals (Current  goals can be found in the Care Plan section) Acute Rehab PT Goals Patient Stated Goal: return to being independent PT Goal Formulation: All assessment and education complete, DC therapy    Frequency     Barriers to discharge        Co-evaluation               End of Session Equipment Utilized During Treatment: Gait belt Activity Tolerance: Patient tolerated treatment well Patient left: in chair;with call bell/phone within reach           Time: 0905-0937 PT Time Calculation (min) (ACUTE ONLY): 32 min   Charges:   PT Evaluation $PT Eval Low Complexity: 1 Procedure PT Treatments $Gait Training: 8-22 mins   PT G Codes:        Weston Anna, MPT Pager: [phone]

## 2015-07-09 NOTE — Evaluation (Signed)
Occupational Therapy Evaluation Patient Details Name: James Fowler MRN: [id number] DOB: 1971/04/14 Today's Date: 07/09/2015    History of Present Illness s/p R DA THA due to AVN   Clinical Impression   This 45 year old man was admitted for the above surgery. All education was completed. No further OT is needed at this time    Follow Up Recommendations  No OT follow up    Equipment Recommendations  None recommended by OT    Recommendations for Other Services       Precautions / Restrictions Precautions Precautions: Fall Restrictions RLE Weight Bearing: Weight bearing as tolerated      Mobility Bed Mobility Overal bed mobility: Needs Assistance Bed Mobility: Supine to Sit;Sit to Supine     Supine to sit: Supervision Sit to supine: Supervision   General bed mobility comments: pt brought RLE off bed himself and crossed L under R to support when getting back into bed  Transfers Overall transfer level: Needs assistance Equipment used: Rolling walker (2 wheeled) Transfers: Sit to/from Stand Sit to Stand: Supervision         General transfer comment: cues for UE placement    Balance                                            ADL Overall ADL's : Needs assistance/impaired                         Toilet Transfer: Supervision/safety;Ambulation;BSC       Tub/ Shower Transfer: Walk-in shower;Supervision/safety;Ambulation     General ADL Comments: cues for hand placement for sit to stand, supervision/set up for ADLs with AE.  Educated on side stepping through tight spaces and working within pain tolerance     Estate agent      Pertinent Vitals/Pain Pain Assessment: 0-10 Pain Score: 5  Pain Location: R hip Pain Descriptors / Indicators: Sore Pain Intervention(s): Limited activity within patient's tolerance;Monitored during session;Premedicated before session;Repositioned;Ice applied     Hand  Dominance     Extremity/Trunk Assessment Upper Extremity Assessment Upper Extremity Assessment: Overall WFL for tasks assessed           Communication Communication Communication: No difficulties   Cognition Arousal/Alertness: Awake/alert Behavior During Therapy: WFL for tasks assessed/performed Overall Cognitive Status: Within Functional Limits for tasks assessed                     General Comments       Exercises       Shoulder Instructions      Home Living Family/patient expects to be discharged to:: Private residence Living Arrangements: Spouse/significant other Available Help at Discharge: Family               Bathroom Shower/Tub: Occupational psychologist: Standard     Home Equipment: Bedside commode (AE kit)          Prior Functioning/Environment Level of Independence: Independent             OT Diagnosis: Acute pain   OT Problem List:     OT Treatment/Interventions:      OT Goals(Current goals can be found in the care plan section) Acute Rehab OT Goals Patient Stated Goal: return to being independent OT Goal Formulation: All  assessment and education complete, DC therapy  OT Frequency:     Barriers to D/C:            Co-evaluation              End of Session    Activity Tolerance: Patient tolerated treatment well Patient left: in bed;with call bell/phone within reach   Time: 0802-0822 OT Time Calculation (min): 20 min Charges:  OT General Charges $OT Visit: 1 Procedure OT Evaluation $OT Eval Low Complexity: 1 Procedure G-Codes:    James Fowler Jul 29, 2015, 8:31 AM James Fowler, OTR/L [phone] 2015-07-29

## 2015-07-11 NOTE — Discharge Summary (Signed)
Physician Discharge Summary  Patient ID: James Fowler MRN: [id number] DOB/AGE: Oct 19, 1970 45 y.o.  Admit date: 07/08/2015 Discharge date: 07/09/2015   Procedures:  Procedure(s) (LRB): RIGHT TOTAL HIP ARTHROPLASTY ANTERIOR APPROACH (Right)  Attending Physician:  Dr. Durene Fowler   Admission Diagnoses:   Right hip AVN / pain  Discharge Diagnoses:  Principal Problem:   S/P right THA, AA Active Problems:   Overweight (BMI 25.0-29.9)  Past Medical History  Diagnosis Date  . Hyperlipidemia   . Hypertension   . VT (ventricular tachycardia) (HCC)     a. 06/2014 ARVD s/p SJM dc AICD.-Dr. Rosette Fowler follows  . Arrhythmogenic right ventricular dysplasia (HCC)     a. diagnosed by MRI 06/2014 following presentation with hemodynamically unstable VT  . 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    HPI:    James Fowler, 45 y.o. male, has a history of pain and functional disability in the right hip(s) due to arthritis and AVN and patient has failed non-surgical conservative treatments for greater than 12 weeks to include NSAID's and/or analgesics and activity modification. Onset of symptoms was gradual starting years ago with gradually worsening course since that time.The patient noted no past surgery on the right hip(s). Patient currently rates pain in the right hip at 9 out of 10 with activity. Patient has worsening of pain with activity and weight bearing, trendelenberg gait, pain that interfers with activities of daily living and pain with passive range of motion. Patient has evidence of periarticular osteophytes, joint space narrowing and AVN by imaging studies. This condition presents safety issues increasing the risk of falls. There is no current active infection. Risks, benefits and expectations were discussed with the patient. Risks including but not limited to the risk of anesthesia, blood clots, nerve damage, blood vessel damage,  failure of the prosthesis, infection and up to and including death. Patient understand the risks, benefits and expectations and wishes to proceed with surgery.   PCP: James Daniel, PA-C   Discharged Condition: good  Hospital Course:  Patient underwent the above stated procedure on 07/08/2015. Patient tolerated the procedure well and brought to the recovery room in good condition and subsequently to the floor.  POD #1 BP: 117/68 ; Pulse: 67 ; Temp: 100.4 F (38 C) ; Resp: 16 Patient reports pain as mild, pain controlled. Little bit of a rough night do to pain, but much better now. No events throughout the night. Ready to be discharged home. Dorsiflexion/plantar flexion intact, incision: dressing C/D/I, no cellulitis present and compartment soft.   LABS  Basename    HGB     14.3  HCT     42.7    Discharge Exam: General appearance: alert, cooperative and no distress Extremities: Homans sign is negative, no sign of DVT, no edema, redness or tenderness in the calves or thighs and no ulcers, gangrene or trophic changes  Disposition: Home with follow up in 2 weeks   Follow-up Information    Follow up with James Pal, MD. Schedule an appointment as soon as possible for a visit in 2 weeks.   Specialty:  Orthopedic Surgery   Contact information:   [address] [place] [address] [phone]       Discharge Instructions    Call MD / Call 911    Complete by:  As directed   If you experience chest pain or shortness of breath, CALL 911 and be transported to the hospital  emergency room.  If you develope a fever above 101 F, pus (white drainage) or increased drainage or redness at the wound, or calf pain, call your surgeon's office.     Change dressing    Complete by:  As directed   Maintain surgical dressing until follow up in the clinic. If the edges start to pull up, may reinforce with tape. If the dressing is no longer working, may remove and cover with gauze  and tape, but must keep the area dry and clean.  Call with any questions or concerns.     Constipation Prevention    Complete by:  As directed   Drink plenty of fluids.  Prune juice may be helpful.  You may use a stool softener, such as Colace (over the counter) 100 mg twice a day.  Use MiraLax (over the counter) for constipation as needed.     Diet - low sodium heart healthy    Complete by:  As directed      Discharge instructions    Complete by:  As directed   Maintain surgical dressing until follow up in the clinic. If the edges start to pull up, may reinforce with tape. If the dressing is no longer working, may remove and cover with gauze and tape, but must keep the area dry and clean.  Follow up in 2 weeks at [facility] Call with any questions or concerns.     Increase activity slowly as tolerated    Complete by:  As directed   Weight bearing as tolerated with assist device (walker, cane, etc) as directed, use it as long as suggested by your surgeon or therapist, typically at least 4-6 weeks.     TED hose    Complete by:  As directed   Use stockings (TED hose) for 2 weeks on both leg(s).  You may remove them at night for sleeping.             Medication List    STOP taking these medications        acetaminophen 500 MG tablet  Commonly known as:  TYLENOL     HYDROcodone-acetaminophen 5-325 MG tablet  Commonly known as:  NORCO/VICODIN  Replaced by:  HYDROcodone-acetaminophen 7.5-325 MG tablet     ibuprofen 200 MG tablet  Commonly known as:  ADVIL,MOTRIN      TAKE these medications        aspirin EC 325 MG tablet  Take 1 tablet (325 mg total) by mouth 2 (two) times daily. Take for 4 weeks.  Start taking on:  07/24/2015     docusate sodium 100 MG capsule  Commonly known as:  COLACE  Take 1 capsule (100 mg total) by mouth 2 (two) times daily.     ferrous sulfate 325 (65 FE) MG tablet  Take 1 tablet (325 mg total) by mouth 3 (three) times daily after meals.       HYDROcodone-acetaminophen 7.5-325 MG tablet  Commonly known as:  NORCO  Take 1-2 tablets by mouth every 4 (four) hours as needed for moderate pain.     methocarbamol 500 MG tablet  Commonly known as:  ROBAXIN  Take 1 tablet (500 mg total) by mouth every 6 (six) hours as needed for muscle spasms.     metoprolol tartrate 25 MG tablet  Commonly known as:  LOPRESSOR  Take 1 tablet (25 mg total) by mouth 2 (two) times daily.     polyethylene glycol packet  Commonly known as:  MIRALAX /  GLYCOLAX  Take 17 g by mouth 2 (two) times daily.     rivaroxaban 10 MG Tabs tablet  Commonly known as:  XARELTO  Take 1 tablet (10 mg total) by mouth daily.         Signed: Anastasio Auerbach. Jayveion Stalling   PA-C  07/11/2015, 10:29 AM

## 2015-07-14 ENCOUNTER — Encounter: Payer: Self-pay | Admitting: Internal Medicine

## 2015-07-24 ENCOUNTER — Ambulatory Visit (INDEPENDENT_AMBULATORY_CARE_PROVIDER_SITE_OTHER): Payer: Commercial Managed Care - HMO | Admitting: *Deleted

## 2015-07-24 DIAGNOSIS — I472 Ventricular tachycardia, unspecified: Secondary | ICD-10-CM

## 2015-07-24 DIAGNOSIS — Z95810 Presence of automatic (implantable) cardiac defibrillator: Secondary | ICD-10-CM

## 2015-07-24 NOTE — Progress Notes (Signed)
Remote ICD transmission.   

## 2015-08-02 LAB — CUP PACEART REMOTE DEVICE CHECK
Implantable Lead Implant Date: 20160211
Implantable Lead Location: 753859
Implantable Lead Model: 7122
MDC IDC LEAD IMPLANT DT: 20160211
MDC IDC LEAD LOCATION: 753860
MDC IDC SESS DTM: 20170311112508
Pulse Gen Serial Number: 7230141

## 2015-08-02 NOTE — Progress Notes (Signed)
Abnormal remote check: 1 VT episode pace terminated  [facility] 10/23/15

## 2015-08-06 ENCOUNTER — Encounter: Payer: Self-pay | Admitting: Cardiology

## 2015-10-06 ENCOUNTER — Encounter: Payer: Self-pay | Admitting: Internal Medicine

## 2015-10-08 ENCOUNTER — Telehealth: Payer: Self-pay | Admitting: *Deleted

## 2015-10-08 NOTE — Telephone Encounter (Signed)
LMOM to call back regarding alert on ICD monitoring from 10/05/15.

## 2015-10-08 NOTE — Telephone Encounter (Signed)
James Fowler returning call- I made him aware of VT episode on Sunday 5/14 requiring ATP. He was unaware/asymptomatic. He verifies that he is taking lopressor 25mg  twice daily. I have advised him of driving restrictions x 6 months per [employer]. Per Dr. Ladona Ridgel- we will continue to monitor for VT and if we see increased VT burden we will arrange in-office follow up prior to December. He is agreeable.

## 2015-10-21 ENCOUNTER — Telehealth: Payer: Self-pay | Admitting: *Deleted

## 2015-10-21 NOTE — Telephone Encounter (Signed)
Spoke to patient regarding episode of VT from 5/28 treated with ATP. Patient denies any sx's of CP, ShOB, dizziness, or syncope. Patient states that he has been taking his Lopressor 25BID.   Will inform Dr.Taylor about episode and notify patient if any further recommendations.  Patient aware of driving restriction x 6 months.

## 2015-10-21 NOTE — Telephone Encounter (Signed)
LMTCB/SSS  Successful ATP on 10/19/15.

## 2015-10-23 ENCOUNTER — Telehealth: Payer: Self-pay | Admitting: Cardiology

## 2015-10-23 ENCOUNTER — Encounter: Payer: Commercial Managed Care - HMO | Admitting: *Deleted

## 2015-10-23 NOTE — Telephone Encounter (Signed)
LMOVM reminding pt to send remote transmission.   

## 2015-10-24 ENCOUNTER — Encounter: Payer: Self-pay | Admitting: Cardiology

## 2015-10-28 ENCOUNTER — Encounter: Payer: Self-pay | Admitting: Internal Medicine

## 2015-10-29 ENCOUNTER — Encounter: Payer: Self-pay | Admitting: Internal Medicine

## 2015-10-29 ENCOUNTER — Telehealth: Payer: Self-pay | Admitting: *Deleted

## 2015-10-29 NOTE — Telephone Encounter (Signed)
lmtcb/sss 

## 2015-10-30 NOTE — Telephone Encounter (Signed)
Spoke to patient regarding episode from 6/4 treated successfully with ATP. Patient denies any sx's of CP, ShOB, dizziness, or syncope. Patient states that he has been taking his Lopressor as Rx'd.  Will inform Dr.Taylor about episodes and notify patient of any further recommendations.  Patient aware of driving restriction x 6 months.  I asked patient to call if he notices any sx's otherwise we will call him to extend driving restriction if needed. Patient voiced understanding.

## 2015-11-24 ENCOUNTER — Encounter: Payer: Self-pay | Admitting: Internal Medicine

## 2016-01-11 ENCOUNTER — Encounter: Payer: Self-pay | Admitting: Internal Medicine

## 2016-01-13 ENCOUNTER — Telehealth: Payer: Self-pay | Admitting: *Deleted

## 2016-01-13 NOTE — Telephone Encounter (Signed)
Spoke to patient regarding (4) VT episodes successfully treated with ATP on 8/19 and on 8/20. Patient states that he was only symptomatic with the episode from 8/19 at 1335. He says that he was building a fence when all of a sudden he felt a brief wave of dizziness and palpitations. Patient denies any other sx's surrounding episodes. Patient states that he has been taking his medications as Rx'd.   Will inform Dr.Taylor about patient's episodes and notify patient of any further recommendations.   Patient aware of 6 months driving restriction.

## 2016-01-20 NOTE — Telephone Encounter (Signed)
Called patient to advise him that Dr. Ladona Ridgel recommended that he schedule an appointment in September.  Patient agreeable to 01/29/16 at 1:45pm.  He is appreciative of call and denies additional questions or concerns at this time.

## 2016-01-23 ENCOUNTER — Encounter: Payer: Self-pay | Admitting: Internal Medicine

## 2016-01-29 ENCOUNTER — Encounter: Payer: Self-pay | Admitting: Internal Medicine

## 2016-01-29 ENCOUNTER — Ambulatory Visit (INDEPENDENT_AMBULATORY_CARE_PROVIDER_SITE_OTHER): Payer: Commercial Managed Care - HMO | Admitting: Internal Medicine

## 2016-01-29 VITALS — BP 104/76 | HR 53 | Ht 70.0 in | Wt 202.2 lb

## 2016-01-29 DIAGNOSIS — I472 Ventricular tachycardia, unspecified: Secondary | ICD-10-CM

## 2016-01-29 LAB — CUP PACEART INCLINIC DEVICE CHECK
Battery Remaining Longevity: 84 mo
Brady Statistic RA Percent Paced: 4.2 %
HIGH POWER IMPEDANCE MEASURED VALUE: 74.25 Ohm
Implantable Lead Implant Date: 20160211
Implantable Lead Location: 753859
Implantable Lead Model: 7122
Lead Channel Impedance Value: 437.5 Ohm
Lead Channel Pacing Threshold Amplitude: 1 V
Lead Channel Pacing Threshold Pulse Width: 0.5 ms
Lead Channel Pacing Threshold Pulse Width: 0.5 ms
Lead Channel Setting Pacing Amplitude: 2.5 V
MDC IDC LEAD IMPLANT DT: 20160211
MDC IDC LEAD LOCATION: 753860
MDC IDC MSMT LEADCHNL RA SENSING INTR AMPL: 3.8 mV
MDC IDC MSMT LEADCHNL RV IMPEDANCE VALUE: 550 Ohm
MDC IDC MSMT LEADCHNL RV PACING THRESHOLD AMPLITUDE: 0.75 V
MDC IDC MSMT LEADCHNL RV SENSING INTR AMPL: 10.3 mV
MDC IDC SESS DTM: 20170907152823
MDC IDC SET LEADCHNL RA PACING AMPLITUDE: 2 V
MDC IDC SET LEADCHNL RV PACING PULSEWIDTH: 0.5 ms
MDC IDC SET LEADCHNL RV SENSING SENSITIVITY: 0.5 mV
MDC IDC STAT BRADY RV PERCENT PACED: 0.09 %
Pulse Gen Serial Number: 7230141

## 2016-01-29 MED ORDER — SOTALOL HCL (AF) 120 MG PO TABS: 120.0000 mg | ORAL_TABLET | Freq: Two times a day (BID) | ORAL | 3 refills | Status: DC

## 2016-01-29 NOTE — Patient Instructions (Addendum)
Medication Instructions:  Your physician has recommended you make the following change in your medication:  1) STOP Metoprolol  2) START Sotalol 120 mg twice a day  Labwork: None Ordered   Testing/Procedures: None Ordered   Follow-Up: Your physician recommends that you schedule a follow-up appointment in: 1 week for EKG   Your physician recommends that you schedule a follow-up appointment in 3 months with Dr. Ladona Ridgel.     Any Other Special Instructions Will Be Listed Below (If Applicable).     If you need a refill on your cardiac medications before your next appointment, please call your pharmacy.

## 2016-01-29 NOTE — Progress Notes (Signed)
HPI James Fowler returns today for followup. He is a pleasant 45 yo man with ARVD diagnosed almost a year ago after presenting with VT. He had inducible VT and MRI confirming the diagnosis. He underwent ICD implant for secondary prevention. In the interim, he has had more VT with successful ATP. He feels palpitations at time but has not had syncope or near syncope.   No Known Allergies   Current Outpatient Prescriptions  Medication Sig Dispense Refill  . metoprolol tartrate (LOPRESSOR) 25 MG tablet Take 1 tablet (25 mg total) by mouth 2 (two) times daily. 60 tablet 10   No current facility-administered medications for this visit.      Past Medical History:  Diagnosis Date  . Arrhythmogenic right ventricular dysplasia (HCC)    a. diagnosed by MRI 06/2014 following presentation with hemodynamically unstable VT  . Hyperlipidemia   . Hypertension   . 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  . VT (ventricular tachycardia) (HCC)    a. 06/2014 ARVD s/p SJM dc AICD.-Dr. Rosette Reveal follows    ROS:   All systems reviewed and negative except as noted in the HPI.   Past Surgical History:  Procedure Laterality Date  . arm surgery  1991   2 steel plates in left arm  . ELECTROPHYSIOLOGY STUDY N/A 07/03/2014   EPS by Dr Graciela Husbands with inducible VT  . IMPLANTABLE CARDIOVERTER DEFIBRILLATOR IMPLANT N/A 07/04/2014   STJ dual chamber ICD implanted by Dr Ladona Ridgel for secondary prevention  . LEFT HEART CATHETERIZATION WITH CORONARY ANGIOGRAM N/A 07/02/2014   no CAD, normal LV function  . TOTAL HIP ARTHROPLASTY Right 07/08/2015   Procedure: RIGHT TOTAL HIP ARTHROPLASTY ANTERIOR APPROACH;  Surgeon: Durene Romans, MD;  Location: [facility];  Service: Orthopedics;  Laterality: Right;  Marland Kitchen VASECTOMY       Family History  Problem Relation Age of Onset  . Alcohol abuse Mother   . Mental illness Father     committed suicide     Social History   Social  History  . Marital status: Married    Spouse name: N/A  . Number of children: N/A  . Years of education: N/A   Occupational History  . Not on file.   Social History Main Topics  . Smoking status: Former Smoker    Years: 5.00    Types: Cigarettes    Quit date: 06/23/2013  . Smokeless tobacco: Never Used  . Alcohol use No  . Drug use: No  . Sexual activity: Yes   Other Topics Concern  . Not on file   Social History Narrative  . No narrative on file     BP 104/76   Pulse (!) 53   Ht 5\' 10"  (1.778 m)   Wt 202 lb 3.2 oz (91.7 kg)   BMI 29.01 kg/m   Physical Exam:  Well appearing NAD HEENT: Unremarkable Neck:  7 cm JVD, no thyromegally Lymphatics:  No adenopathy Back:  No CVA tenderness Lungs:  Clear with no wheezes, rales or rhonchi. Well healed ICD incision. HEART:  Regular rate rhythm, no murmurs, no rubs, no clicks Abd:  soft, positive bowel sounds, no organomegally, no rebound, no guarding Ext:  2 plus pulses, no edema, no cyanosis, no clubbing Skin:  No rashes no nodules Neuro:  CN II through XII intact, motor grossly intact   DEVICE  Normal device function.  See PaceArt for details.   Assess/Plan: 1. VT -  he has had more ventricular arrhythmias. Will start sotalol and stop metoprolol. Will attempt to uptitrate his sotalol if the VT is not controlled on 120 bid. Will recheck a 12 lead ECG in a week. 2. ICD - his st. Jude device has been reprogrammed. We reduced his VT zone from 190/min down to 180/min. 3. ARVD - he has no right sided CHF symptoms.   James Fowler,M.D.

## 2016-02-05 ENCOUNTER — Ambulatory Visit (INDEPENDENT_AMBULATORY_CARE_PROVIDER_SITE_OTHER): Payer: Commercial Managed Care - HMO | Admitting: *Deleted

## 2016-02-05 DIAGNOSIS — I472 Ventricular tachycardia, unspecified: Secondary | ICD-10-CM

## 2016-02-05 LAB — BASIC METABOLIC PANEL
BUN: 14 mg/dL (ref 7–25)
CALCIUM: 10 mg/dL (ref 8.6–10.3)
CO2: 24 mmol/L (ref 20–31)
CREATININE: 0.99 mg/dL (ref 0.60–1.35)
Chloride: 106 mmol/L (ref 98–110)
Glucose, Bld: 85 mg/dL (ref 65–99)
Potassium: 4.2 mmol/L (ref 3.5–5.3)
Sodium: 137 mmol/L (ref 135–146)

## 2016-02-05 LAB — MAGNESIUM: MAGNESIUM: 2.1 mg/dL (ref 1.5–2.5)

## 2016-02-05 NOTE — Progress Notes (Signed)
Patient in today for EKG one week post initiation of Sotalol 120 mg bid.  He feels much better.  EKG reviewed by Gypsy Balsam, NP. She did want a BMP and Mag ordered.

## 2016-05-05 ENCOUNTER — Encounter (INDEPENDENT_AMBULATORY_CARE_PROVIDER_SITE_OTHER): Payer: Self-pay

## 2016-05-05 ENCOUNTER — Encounter: Payer: Self-pay | Admitting: Internal Medicine

## 2016-05-05 ENCOUNTER — Ambulatory Visit (INDEPENDENT_AMBULATORY_CARE_PROVIDER_SITE_OTHER): Payer: Commercial Managed Care - HMO | Admitting: Internal Medicine

## 2016-05-05 VITALS — BP 118/80 | HR 59 | Ht 70.0 in | Wt 207.4 lb

## 2016-05-05 DIAGNOSIS — I472 Ventricular tachycardia, unspecified: Secondary | ICD-10-CM

## 2016-05-05 DIAGNOSIS — Z95810 Presence of automatic (implantable) cardiac defibrillator: Secondary | ICD-10-CM

## 2016-05-05 NOTE — Patient Instructions (Addendum)
Medication Instructions:  Your physician recommends that you continue on your current medications as directed. Please refer to the Current Medication list given to you today.   Labwork: None Ordered   Testing/Procedures: None Ordered   Follow-Up: Your physician wants you to follow-up in: 1 year with Dr. Taylor. You will receive a reminder letter in the mail two months in advance. If you don't receive a letter, please call our office to schedule the follow-up appointment.  Remote monitoring is used to monitor your ICD from home. This monitoring reduces the number of office visits required to check your device to one time per year. It allows us to keep an eye on the functioning of your device to ensure it is working properly. You are scheduled for a device check from home on 08/04/16. You may send your transmission at any time that day. If you have a wireless device, the transmission will be sent automatically. After your physician reviews your transmission, you will receive a postcard with your next transmission date.    Any Other Special Instructions Will Be Listed Below (If Applicable).     If you need a refill on your cardiac medications before your next appointment, please call your pharmacy.   

## 2016-05-05 NOTE — Progress Notes (Signed)
HPI Mr. Macisaac returns today for followup. He is a pleasant 45 yo man with ARVD diagnosed about 2 years ago after presenting with VT. He had inducible VT and MRI confirming the diagnosis. He underwent ICD implant for secondary prevention. In the interim, he has had done well with no additional VT on Sotalol. He is exercising.  No Known Allergies   Current Outpatient Prescriptions  Medication Sig Dispense Refill  . SOTALOL AF 120 MG TABS Take 120 mg by mouth 2 (two) times daily. 180 each 3   No current facility-administered medications for this visit.      Past Medical History:  Diagnosis Date  . Arrhythmogenic right ventricular dysplasia (HCC)    a. diagnosed by MRI 06/2014 following presentation with hemodynamically unstable VT  . Hyperlipidemia   . Hypertension   . 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  . VT (ventricular tachycardia) (HCC)    a. 06/2014 ARVD s/p SJM dc AICD.-Dr. Rosette Reveal follows    ROS:   All systems reviewed and negative except as noted in the HPI.   Past Surgical History:  Procedure Laterality Date  . arm surgery  1991   2 steel plates in left arm  . ELECTROPHYSIOLOGY STUDY N/A 07/03/2014   EPS by Dr Graciela Husbands with inducible VT  . IMPLANTABLE CARDIOVERTER DEFIBRILLATOR IMPLANT N/A 07/04/2014   STJ dual chamber ICD implanted by Dr Ladona Ridgel for secondary prevention  . LEFT HEART CATHETERIZATION WITH CORONARY ANGIOGRAM N/A 07/02/2014   no CAD, normal LV function  . TOTAL HIP ARTHROPLASTY Right 07/08/2015   Procedure: RIGHT TOTAL HIP ARTHROPLASTY ANTERIOR APPROACH;  Surgeon: Durene Romans, MD;  Location: [facility];  Service: Orthopedics;  Laterality: Right;  Marland Kitchen VASECTOMY       Family History  Problem Relation Age of Onset  . Alcohol abuse Mother   . Mental illness Father     committed suicide     Social History   Social History  . Marital status: Married    Spouse name: N/A  . Number of children:  N/A  . Years of education: N/A   Occupational History  . Not on file.   Social History Main Topics  . Smoking status: Former Smoker    Years: 5.00    Types: Cigarettes    Quit date: 06/23/2013  . Smokeless tobacco: Never Used  . Alcohol use No  . Drug use: No  . Sexual activity: Yes   Other Topics Concern  . Not on file   Social History Narrative  . No narrative on file     BP 118/80   Pulse (!) 59   Ht 5\' 10"  (1.778 m)   Wt 207 lb 6.4 oz (94.1 kg)   BMI 29.76 kg/m   Physical Exam:  Well appearing NAD HEENT: Unremarkable Neck:  7 cm JVD, no thyromegally Lymphatics:  No adenopathy Back:  No CVA tenderness Lungs:  Clear with no wheezes, rales or rhonchi. Well healed ICD incision. HEART:  Regular rate rhythm, no murmurs, no rubs, no clicks Abd:  soft, positive bowel sounds, no organomegally, no rebound, no guarding Ext:  2 plus pulses, no edema, no cyanosis, no clubbing Skin:  No rashes no nodules Neuro:  CN II through XII intact, motor grossly intact   DEVICE  Normal device function.  See PaceArt for details.   Assess/Plan: 1. VT - he has had  No more ventricular arrhythmias on sotalol. He will  continue 120 bid. 2. ICD - his st. Jude device is working normally. Will follow. 3. ARVD - he has no right sided CHF symptoms. Will follow.  Leonia ReevesGregg Taylor,M.D.

## 2016-08-04 ENCOUNTER — Ambulatory Visit (INDEPENDENT_AMBULATORY_CARE_PROVIDER_SITE_OTHER): Payer: Commercial Managed Care - HMO | Admitting: *Deleted

## 2016-08-04 DIAGNOSIS — I472 Ventricular tachycardia, unspecified: Secondary | ICD-10-CM

## 2016-08-04 NOTE — Progress Notes (Signed)
Remote ICD transmission.   

## 2016-08-05 ENCOUNTER — Encounter: Payer: Self-pay | Admitting: Cardiology

## 2016-08-06 LAB — CUP PACEART REMOTE DEVICE CHECK
Battery Remaining Longevity: 72 mo
Battery Remaining Percentage: 75 %
Date Time Interrogation Session: 20180316151512
HighPow Impedance: 73 Ohm
Implantable Lead Implant Date: 20160211
Implantable Lead Location: 753860
Implantable Lead Model: 7122
Implantable Pulse Generator Implant Date: 20160211
Lead Channel Impedance Value: 430 Ohm
Lead Channel Sensing Intrinsic Amplitude: 9.7 mV
MDC IDC LEAD IMPLANT DT: 20160211
MDC IDC LEAD LOCATION: 753859
MDC IDC MSMT LEADCHNL RA SENSING INTR AMPL: 2.6 mV
MDC IDC MSMT LEADCHNL RV IMPEDANCE VALUE: 510 Ohm
MDC IDC SET LEADCHNL RA PACING AMPLITUDE: 2 V
MDC IDC SET LEADCHNL RV PACING AMPLITUDE: 2.5 V
MDC IDC SET LEADCHNL RV PACING PULSEWIDTH: 0.5 ms
MDC IDC SET LEADCHNL RV SENSING SENSITIVITY: 0.5 mV
MDC IDC STAT BRADY RA PERCENT PACED: 4.8 %
MDC IDC STAT BRADY RV PERCENT PACED: 1 % — AB
Pulse Gen Serial Number: 7230141

## 2016-10-11 ENCOUNTER — Telehealth: Payer: Self-pay

## 2016-10-11 NOTE — Telephone Encounter (Signed)
Spoke with patient regarding ATP episode on 5/20. Patient reports that he was mowing the yard and that he was "in a hurry". He denied any symptoms at time of episode and stated he was taking his sotalol as prescribed. I explained that I will call back if Dr. Ladona Ridgel has any additional recommendations. He verbalized understanding.

## 2016-11-03 ENCOUNTER — Encounter: Payer: Commercial Managed Care - HMO | Admitting: *Deleted

## 2016-11-03 ENCOUNTER — Telehealth: Payer: Self-pay | Admitting: Cardiology

## 2016-11-03 NOTE — Telephone Encounter (Signed)
Spoke with pt and reminded pt of remote transmission that is due today. Pt verbalized understanding.   

## 2016-11-05 ENCOUNTER — Encounter: Payer: Self-pay | Admitting: Cardiology

## 2016-12-13 ENCOUNTER — Other Ambulatory Visit: Payer: Self-pay | Admitting: Internal Medicine

## 2017-02-02 ENCOUNTER — Other Ambulatory Visit: Payer: Self-pay | Admitting: Internal Medicine

## 2017-02-14 ENCOUNTER — Telehealth: Payer: Self-pay | Admitting: Internal Medicine

## 2017-02-14 NOTE — Telephone Encounter (Signed)
Per device clinic Pt last treated episode was 04/13/2016.  Pt may drive as of 02/06/9449.  Letter created clearing Pt to drive as requested.  Will leave at front desk.  Pt indicates understanding.

## 2017-02-14 NOTE — Telephone Encounter (Signed)
James Fowler is calling to see when the will be up so that he may drive again . Was told that he could not drive for 6 months . He is trying to change positions at work and need a  Release To Drive form . Please call

## 2017-02-16 ENCOUNTER — Telehealth: Payer: Self-pay | Admitting: *Deleted

## 2017-02-16 ENCOUNTER — Ambulatory Visit (INDEPENDENT_AMBULATORY_CARE_PROVIDER_SITE_OTHER): Payer: 59 | Admitting: *Deleted

## 2017-02-16 DIAGNOSIS — I472 Ventricular tachycardia, unspecified: Secondary | ICD-10-CM

## 2017-02-16 NOTE — Progress Notes (Signed)
Remote ICD transmission.   

## 2017-02-16 NOTE — Telephone Encounter (Signed)
Remote transmission alert received 02/12/17 for 4 episodes of VT 02/11/17 evening, all of which were terminated with ATP x1.   LMOM to return call to Device Clinic regarding ICD alert.  Letter picked up 02/15/17 stating he could drive- he needs to be made aware that this is not the case, he should refrain from driving for 6 months from 02/11/17 per [facility].

## 2017-02-16 NOTE — Telephone Encounter (Signed)
Mr. Crogan calling back. He does not recall any symptoms from 6-6:30pm Friday, he was sitting on the couch watching kids and TV. He has taken Sotalol 120mg  twice daily. I made him aware of driving restrictions and to disregard the letter he picked up yesterday. He verbalizes understanding and is appreciative of call.  Episodes printed for Dr. Lubertha Basque review.

## 2017-02-17 ENCOUNTER — Encounter: Payer: Self-pay | Admitting: Cardiology

## 2017-02-18 LAB — CUP PACEART REMOTE DEVICE CHECK
Implantable Lead Implant Date: 20160211
Implantable Lead Location: 753859
Implantable Lead Model: 7122
Implantable Pulse Generator Implant Date: 20160211
MDC IDC LEAD IMPLANT DT: 20160211
MDC IDC LEAD LOCATION: 753860
MDC IDC SESS DTM: 20180928140427
Pulse Gen Serial Number: 7230141

## 2017-04-29 ENCOUNTER — Encounter: Payer: Self-pay | Admitting: Internal Medicine

## 2017-05-04 ENCOUNTER — Other Ambulatory Visit: Payer: Self-pay | Admitting: Internal Medicine

## 2017-05-09 ENCOUNTER — Other Ambulatory Visit: Payer: Self-pay | Admitting: Internal Medicine

## 2017-05-18 ENCOUNTER — Ambulatory Visit (INDEPENDENT_AMBULATORY_CARE_PROVIDER_SITE_OTHER): Payer: 59 | Admitting: Internal Medicine

## 2017-05-18 ENCOUNTER — Encounter: Payer: Self-pay | Admitting: Internal Medicine

## 2017-05-18 ENCOUNTER — Encounter (INDEPENDENT_AMBULATORY_CARE_PROVIDER_SITE_OTHER): Payer: Self-pay

## 2017-05-18 VITALS — BP 110/86 | HR 50 | Ht 70.0 in | Wt 207.8 lb

## 2017-05-18 DIAGNOSIS — I472 Ventricular tachycardia, unspecified: Secondary | ICD-10-CM

## 2017-05-18 DIAGNOSIS — I428 Other cardiomyopathies: Secondary | ICD-10-CM

## 2017-05-18 DIAGNOSIS — Z95810 Presence of automatic (implantable) cardiac defibrillator: Secondary | ICD-10-CM | POA: Diagnosis not present

## 2017-05-18 NOTE — Patient Instructions (Signed)
Medication Instructions:  Your physician recommends that you continue on your current medications as directed. Please refer to the Current Medication list given to you today.  Labwork: None ordered.  Testing/Procedures: None ordered.  Follow-Up: Your physician wants you to follow-up in: one year with Dr. Ladona Ridgel.   You will receive a reminder letter in the mail two months in advance. If you don't receive a letter, please call our office to schedule the follow-up appointment.  Remote monitoring is used to monitor your ICD from home. This monitoring reduces the number of office visits required to check your device to one time per year. It allows [place] to keep an eye on the functioning of your device to ensure it is working properly. You are scheduled for a device check from home on 05/19/2017. You may send your transmission at any time that day. If you have a wireless device, the transmission will be sent automatically. After your physician reviews your transmission, you will receive a postcard with your next transmission date.  Any Other Special Instructions Will Be Listed Below (If Applicable).  If you need a refill on your cardiac medications before your next appointment, please call your pharmacy.

## 2017-05-18 NOTE — Progress Notes (Signed)
HPI 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 He is a 46 yo man with the above problems and has been maintained on sotalol. He remains active chopping wood with an axe. He has not felt any palpitations. No chest pain or sob. No syncope.  No Known Allergies   Current Outpatient Medications  Medication Sig Dispense Refill  . SOTALOL AF 120 MG TABS Take 1 tablet (120 mg total) by mouth 2 (two) times daily. Please keep 12-26 at 4:20 pm appointment for additional refills thanks. 180 tablet 0   No current facility-administered medications for this visit.      Past Medical History:  Diagnosis Date  . Arrhythmogenic right ventricular dysplasia (HCC)    a. diagnosed by MRI 06/2014 following presentation with hemodynamically unstable VT  . Hyperlipidemia   . Hypertension   . 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  . VT (ventricular tachycardia) (HCC)    a. 06/2014 ARVD s/p SJM dc AICD.-Dr. Rosette Fowler. James Fowler follows    ROS:   All systems reviewed and negative except as noted in the HPI.   Past Surgical History:  Procedure Laterality Date  . arm surgery  1991   2 steel plates in left arm  . ELECTROPHYSIOLOGY STUDY N/A 07/03/2014   EPS by Dr James Fowler with inducible VT  . IMPLANTABLE CARDIOVERTER DEFIBRILLATOR IMPLANT N/A 07/04/2014   STJ dual chamber ICD implanted by Dr James Fowler for secondary prevention  . LEFT HEART CATHETERIZATION WITH CORONARY ANGIOGRAM N/A 07/02/2014   no CAD, normal LV function  . TOTAL HIP ARTHROPLASTY Right 07/08/2015   Procedure: RIGHT TOTAL HIP ARTHROPLASTY ANTERIOR APPROACH;  Surgeon: James RomansMatthew Olin, MD;  Location: [facility];  Service: Orthopedics;  Laterality: Right;  Marland Kitchen. VASECTOMY       Family History  Problem Relation Age of Onset  . Alcohol abuse Mother   . Mental illness Father        committed suicide     Social History   Socioeconomic History  . Marital status: Married   Spouse name: Not on file  . Number of children: Not on file  . Years of education: Not on file  . Highest education level: Not on file  Social Needs  . Financial resource strain: Not on file  . Food insecurity - worry: Not on file  . Food insecurity - inability: Not on file  . Transportation needs - medical: Not on file  . Transportation needs - non-medical: Not on file  Occupational History  . Not on file  Tobacco Use  . Smoking status: Former Smoker    Years: 5.00    Types: Cigarettes    Last attempt to quit: 06/23/2013    Years since quitting: 3.9  . Smokeless tobacco: Never Used  Substance and Sexual Activity  . Alcohol use: No  . Drug use: No  . Sexual activity: Yes  Other Topics Concern  . Not on file  Social History Narrative  . Not on file     BP 110/86   Pulse (!) 50   Ht 5\' 10"  (1.778 m)   Wt 207 lb 12.8 oz (94.3 kg)   SpO2 95%   BMI 29.82 kg/m   Physical Exam:  Well appearing NAD HEENT: Unremarkable Neck:  No JVD, no thyromegally Lymphatics:  No adenopathy Back:  No CVA tenderness Lungs:  Clear HEART:  Regular rate rhythm, no murmurs, no rubs, no  clicks Abd:  soft, positive bowel sounds, no organomegally, no rebound, no guarding Ext:  2 plus pulses, no edema, no cyanosis, no clubbing Skin:  No rashes no nodules Neuro:  CN II through XII intact, motor grossly intact  DEVICE  Normal device function.  See PaceArt for details.   Assess/Plan: 1. ARVC - his VT has been reasonably well controlled. He will continue sotalol. 2. HTN - his blood pressure is well controlled. No change in meds. 3. Dyslipidemia - he is not on a statin but remains quite active. We will check his lipids on return in a year.

## 2017-05-19 ENCOUNTER — Ambulatory Visit (INDEPENDENT_AMBULATORY_CARE_PROVIDER_SITE_OTHER): Payer: 59 | Admitting: *Deleted

## 2017-05-19 DIAGNOSIS — I472 Ventricular tachycardia, unspecified: Secondary | ICD-10-CM

## 2017-05-19 NOTE — Progress Notes (Signed)
Remote ICD transmission.   

## 2017-05-20 ENCOUNTER — Encounter: Payer: Self-pay | Admitting: Cardiology

## 2017-06-06 LAB — CUP PACEART REMOTE DEVICE CHECK
Date Time Interrogation Session: 20190114152554
Implantable Lead Implant Date: 20160211
Implantable Lead Implant Date: 20160211
Implantable Lead Location: 753860
Lead Channel Setting Sensing Sensitivity: 0.5 mV
MDC IDC LEAD LOCATION: 753859
MDC IDC MSMT BATTERY REMAINING LONGEVITY: 66 mo
MDC IDC MSMT BATTERY REMAINING PERCENTAGE: 69 %
MDC IDC PG IMPLANT DT: 20160211
MDC IDC SET LEADCHNL RA PACING AMPLITUDE: 2 V
MDC IDC SET LEADCHNL RV PACING AMPLITUDE: 2.5 V
MDC IDC SET LEADCHNL RV PACING PULSEWIDTH: 0.5 ms
Pulse Gen Serial Number: 7230141

## 2017-06-14 LAB — CUP PACEART INCLINIC DEVICE CHECK
Brady Statistic RA Percent Paced: 6.2 %
Date Time Interrogation Session: 20181226145718
HighPow Impedance: 77.625
Implantable Lead Implant Date: 20160211
Implantable Lead Location: 753859
Implantable Lead Location: 753860
Implantable Lead Model: 7122
Lead Channel Impedance Value: 387.5 Ohm
Lead Channel Impedance Value: 550 Ohm
Lead Channel Pacing Threshold Amplitude: 0.75 V
Lead Channel Pacing Threshold Pulse Width: 0.5 ms
Lead Channel Sensing Intrinsic Amplitude: 10 mV
Lead Channel Sensing Intrinsic Amplitude: 4.2 mV
Lead Channel Setting Pacing Amplitude: 2 V
Lead Channel Setting Pacing Pulse Width: 0.5 ms
Lead Channel Setting Sensing Sensitivity: 0.5 mV
MDC IDC LEAD IMPLANT DT: 20160211
MDC IDC MSMT BATTERY REMAINING LONGEVITY: 74 mo
MDC IDC MSMT LEADCHNL RA PACING THRESHOLD AMPLITUDE: 0.75 V
MDC IDC MSMT LEADCHNL RA PACING THRESHOLD PULSEWIDTH: 0.5 ms
MDC IDC PG IMPLANT DT: 20160211
MDC IDC PG SERIAL: 7230141
MDC IDC SET LEADCHNL RV PACING AMPLITUDE: 2.5 V
MDC IDC STAT BRADY RV PERCENT PACED: 0.2 %

## 2017-08-02 ENCOUNTER — Other Ambulatory Visit: Payer: Self-pay | Admitting: Internal Medicine

## 2017-08-03 ENCOUNTER — Telehealth: Payer: Self-pay

## 2017-08-03 NOTE — Telephone Encounter (Signed)
LVM for pt to call back regarding ATP episode from 3/12@3 :15pm

## 2017-08-03 NOTE — Telephone Encounter (Signed)
Spoke with pt regarding ATP episode from 08/02/17 pt reported that he just felt his heart rate speed up, pt reported compliance with Sotalol 120mg  BID and pt aware of driving restrictions x 6 months. Informed pt that someone would call back if Dr. Ladona Ridgel had further recommendations. Pt voiced understanding

## 2017-08-12 ENCOUNTER — Other Ambulatory Visit: Payer: Self-pay | Admitting: Internal Medicine

## 2017-08-17 ENCOUNTER — Telehealth: Payer: Self-pay | Admitting: *Deleted

## 2017-08-17 NOTE — Telephone Encounter (Signed)
Alert for VT with ATP 08/14/17 at 2:06pm. [facility] with details (per DPR)- no recommendations from similar episode 08/02/17 after Dr. Lubertha Basque review. Made aware no driving x 6 months from 8/67/54. Gave direct # to [facility] if he has questions.

## 2017-08-17 NOTE — Telephone Encounter (Signed)
Reviewed with Dr. Ladona Ridgel- he recommends to increase Sotalol dose to 160mg  IF he has more VT after 08/14/17 episode.

## 2017-08-18 ENCOUNTER — Ambulatory Visit (INDEPENDENT_AMBULATORY_CARE_PROVIDER_SITE_OTHER): Payer: 59 | Admitting: *Deleted

## 2017-08-18 DIAGNOSIS — I472 Ventricular tachycardia, unspecified: Secondary | ICD-10-CM

## 2017-08-19 NOTE — Progress Notes (Signed)
Remote ICD transmission.   

## 2017-08-22 ENCOUNTER — Encounter: Payer: Self-pay | Admitting: Cardiology

## 2017-08-24 ENCOUNTER — Other Ambulatory Visit: Payer: Self-pay | Admitting: Internal Medicine

## 2017-09-06 LAB — CUP PACEART REMOTE DEVICE CHECK
Battery Remaining Longevity: 63 mo
Battery Remaining Percentage: 67 %
Date Time Interrogation Session: 20190416090935
HIGH POWER IMPEDANCE MEASURED VALUE: 79 Ohm
Implantable Lead Implant Date: 20160211
Implantable Lead Implant Date: 20160211
Implantable Lead Location: 753860
Implantable Pulse Generator Implant Date: 20160211
Lead Channel Impedance Value: 530 Ohm
Lead Channel Sensing Intrinsic Amplitude: 9 mV
Lead Channel Setting Pacing Pulse Width: 0.5 ms
MDC IDC LEAD LOCATION: 753859
MDC IDC MSMT LEADCHNL RA IMPEDANCE VALUE: 360 Ohm
MDC IDC MSMT LEADCHNL RA SENSING INTR AMPL: 3.6 mV
MDC IDC PG SERIAL: 7230141
MDC IDC SET LEADCHNL RA PACING AMPLITUDE: 2 V
MDC IDC SET LEADCHNL RV PACING AMPLITUDE: 2.5 V
MDC IDC SET LEADCHNL RV SENSING SENSITIVITY: 0.5 mV
MDC IDC STAT BRADY RA PERCENT PACED: 3.3 %
MDC IDC STAT BRADY RV PERCENT PACED: 1 % — AB

## 2017-11-17 ENCOUNTER — Encounter: Payer: 59 | Admitting: *Deleted

## 2017-11-17 ENCOUNTER — Telehealth: Payer: Self-pay

## 2017-11-17 NOTE — Telephone Encounter (Signed)
LMOVM reminding pt to send remote transmission.   

## 2017-12-16 ENCOUNTER — Encounter: Payer: Self-pay | Admitting: Cardiology

## 2018-02-16 ENCOUNTER — Ambulatory Visit (INDEPENDENT_AMBULATORY_CARE_PROVIDER_SITE_OTHER): Payer: 59 | Admitting: *Deleted

## 2018-02-16 DIAGNOSIS — I472 Ventricular tachycardia, unspecified: Secondary | ICD-10-CM

## 2018-02-16 NOTE — Progress Notes (Signed)
Remote ICD transmission.   

## 2018-02-17 ENCOUNTER — Encounter: Payer: Self-pay | Admitting: Cardiology

## 2018-03-29 LAB — CUP PACEART REMOTE DEVICE CHECK
Date Time Interrogation Session: 20191106102325
Implantable Lead Location: 753860
Implantable Lead Model: 7122
Implantable Pulse Generator Implant Date: 20160211
MDC IDC LEAD IMPLANT DT: 20160211
MDC IDC LEAD IMPLANT DT: 20160211
MDC IDC LEAD LOCATION: 753859
Pulse Gen Serial Number: 7230141

## 2018-03-31 ENCOUNTER — Telehealth: Payer: Self-pay | Admitting: *Deleted

## 2018-03-31 NOTE — Telephone Encounter (Signed)
September transmission reviewed. Morphology matching for NSVT. Need to make programming changes per GT.   Patient agreeable to 2:30pm appt on Monday.

## 2018-04-03 ENCOUNTER — Ambulatory Visit (INDEPENDENT_AMBULATORY_CARE_PROVIDER_SITE_OTHER): Payer: 59 | Admitting: *Deleted

## 2018-04-03 DIAGNOSIS — I472 Ventricular tachycardia, unspecified: Secondary | ICD-10-CM

## 2018-04-03 DIAGNOSIS — Z95810 Presence of automatic (implantable) cardiac defibrillator: Secondary | ICD-10-CM | POA: Diagnosis not present

## 2018-04-03 LAB — CUP PACEART INCLINIC DEVICE CHECK
HighPow Impedance: 78.75 Ohm
Implantable Lead Implant Date: 20160211
Implantable Lead Implant Date: 20160211
Implantable Lead Model: 7122
Implantable Pulse Generator Implant Date: 20160211
Lead Channel Impedance Value: 387.5 Ohm
Lead Channel Pacing Threshold Amplitude: 0.5 V
Lead Channel Pacing Threshold Amplitude: 0.5 V
Lead Channel Pacing Threshold Amplitude: 0.75 V
Lead Channel Pacing Threshold Pulse Width: 0.5 ms
Lead Channel Sensing Intrinsic Amplitude: 10.1 mV
Lead Channel Sensing Intrinsic Amplitude: 5 mV
Lead Channel Setting Pacing Amplitude: 2 V
Lead Channel Setting Pacing Pulse Width: 0.5 ms
MDC IDC LEAD LOCATION: 753859
MDC IDC LEAD LOCATION: 753860
MDC IDC MSMT BATTERY REMAINING LONGEVITY: 66 mo
MDC IDC MSMT LEADCHNL RA PACING THRESHOLD AMPLITUDE: 0.75 V
MDC IDC MSMT LEADCHNL RA PACING THRESHOLD PULSEWIDTH: 0.5 ms
MDC IDC MSMT LEADCHNL RA PACING THRESHOLD PULSEWIDTH: 0.5 ms
MDC IDC MSMT LEADCHNL RV IMPEDANCE VALUE: 512.5 Ohm
MDC IDC MSMT LEADCHNL RV PACING THRESHOLD PULSEWIDTH: 0.5 ms
MDC IDC PG SERIAL: 7230141
MDC IDC SESS DTM: 20191111150138
MDC IDC SET LEADCHNL RV PACING AMPLITUDE: 2.5 V
MDC IDC SET LEADCHNL RV SENSING SENSITIVITY: 0.5 mV
MDC IDC STAT BRADY RA PERCENT PACED: 3.9 %
MDC IDC STAT BRADY RV PERCENT PACED: 0.15 %

## 2018-04-03 NOTE — Progress Notes (Signed)
ICD check in clinic for reprogramming due to morphology template match during NSVT episode on 9/26 remote transmission. Normal device function. Thresholds and sensing consistent with previous device measurements. Impedance trends stable over time. No evidence of any ventricular arrhythmias since last remote on 02/16/18. No mode switches. Histogram distribution appropriate for patient and level of activity. Device programmed at appropriate safety margins. Changes per GT: reprogrammed morphology type to [facility], VT detection rate lowered to 179bpm ( ), sudden onset fixed delta to 8ms to accommodate lower zone, VT NID increased to 40, VF NID decreased to 24. Device programmed to optimize intrinsic conduction. Estimated longevity 4.9-5.5 years. Patient enrolled in remote follow-up. Patient education completed including shock plan. Patient aware of vibratory alert. ROV with GT on 05/25/18 and Merlin on 07/03/18.

## 2018-05-01 ENCOUNTER — Other Ambulatory Visit: Payer: Self-pay | Admitting: Internal Medicine

## 2018-05-25 ENCOUNTER — Encounter: Payer: Self-pay | Admitting: Internal Medicine

## 2018-05-25 ENCOUNTER — Ambulatory Visit: Payer: 59 | Admitting: Internal Medicine

## 2018-05-25 ENCOUNTER — Encounter: Payer: 59 | Admitting: Internal Medicine

## 2018-05-25 VITALS — BP 118/82 | HR 66 | Ht 70.0 in | Wt 203.2 lb

## 2018-05-25 DIAGNOSIS — I472 Ventricular tachycardia, unspecified: Secondary | ICD-10-CM

## 2018-05-25 DIAGNOSIS — Z95810 Presence of automatic (implantable) cardiac defibrillator: Secondary | ICD-10-CM

## 2018-05-25 DIAGNOSIS — I428 Other cardiomyopathies: Secondary | ICD-10-CM

## 2018-05-25 NOTE — Progress Notes (Signed)
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 He is a 48 yo man with the above problems and has been maintained on sotalol. He remains active chopping wood with an axe. He has not felt any palpitations. No chest pain or sob. No syncope.   No Known Allergies   Current Outpatient Medications  Medication Sig Dispense Refill  . atorvastatin (LIPITOR) 20 MG tablet Take 1 tablet by mouth daily.    Marland Kitchen SOTALOL AF 120 MG TABS TAKE 1 TABLET BY MOUTH TWICE DAILY. KEEP APPOINTMENT FOR FURTHER REFILLS 180 tablet 0   No current facility-administered medications for this visit.      Past Medical History:  Diagnosis Date  . Arrhythmogenic right ventricular dysplasia (HCC)    a. diagnosed by MRI 06/2014 following presentation with hemodynamically unstable VT  . Hyperlipidemia   . Hypertension   . 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  . VT (ventricular tachycardia) (HCC)    a. 06/2014 ARVD s/p SJM dc AICD.-Dr. Rosette Reveal follows    ROS:   All systems reviewed and negative except as noted in the HPI.   Past Surgical History:  Procedure Laterality Date  . arm surgery  1991   2 steel plates in left arm  . ELECTROPHYSIOLOGY STUDY N/A 07/03/2014   EPS by Dr Graciela Husbands with inducible VT  . IMPLANTABLE CARDIOVERTER DEFIBRILLATOR IMPLANT N/A 07/04/2014   STJ dual chamber ICD implanted by Dr Ladona Ridgel for secondary prevention  . LEFT HEART CATHETERIZATION WITH CORONARY ANGIOGRAM N/A 07/02/2014   no CAD, normal LV function  . TOTAL HIP ARTHROPLASTY Right 07/08/2015   Procedure: RIGHT TOTAL HIP ARTHROPLASTY ANTERIOR APPROACH;  Surgeon: Durene Romans, MD;  Location: [facility];  Service: Orthopedics;  Laterality: Right;  Marland Kitchen VASECTOMY       Family History  Problem Relation Age of Onset  . Alcohol abuse Mother   . Mental illness Father        committed suicide     Social History   Socioeconomic History  . Marital  status: Married    Spouse name: Not on file  . Number of children: Not on file  . Years of education: Not on file  . Highest education level: Not on file  Occupational History  . Not on file  Social Needs  . Financial resource strain: Not on file  . Food insecurity:    Worry: Not on file    Inability: Not on file  . Transportation needs:    Medical: Not on file    Non-medical: Not on file  Tobacco Use  . Smoking status: Former Smoker    Years: 5.00    Types: Cigarettes    Last attempt to quit: 06/23/2013    Years since quitting: 4.9  . Smokeless tobacco: Never Used  Substance and Sexual Activity  . Alcohol use: No  . Drug use: No  . Sexual activity: Yes  Lifestyle  . Physical activity:    Days per week: Not on file    Minutes per session: Not on file  . Stress: Not on file  Relationships  . Social connections:    Talks on phone: Not on file    Gets together: Not on file    Attends religious service: Not on file    Active member of club or organization: Not on file    Attends meetings of clubs or organizations: Not on  file    Relationship status: Not on file  . Intimate partner violence:    Fear of current or ex partner: Not on file    Emotionally abused: Not on file    Physically abused: Not on file    Forced sexual activity: Not on file  Other Topics Concern  . Not on file  Social History Narrative  . Not on file     BP 118/82   Pulse 66   Ht 5\' 10"  (1.778 m)   Wt 203 lb 3.2 oz (92.2 kg)   SpO2 99%   BMI 29.16 kg/m   Physical Exam:  Well appearing NAD HEENT: Unremarkable Neck:  No JVD, no thyromegally Lymphatics:  No adenopathy Back:  No CVA tenderness Lungs:  Clear HEART:  Regular rate rhythm, no murmurs, no rubs, no clicks Abd:  soft, positive bowel sounds, no organomegally, no rebound, no guarding Ext:  2 plus pulses, no edema, no cyanosis, no clubbing Skin:  No rashes no nodules Neuro:  CN II through XII intact, motor grossly intact  EKG -  nsr  DEVICE  Normal device function.  See PaceArt for details.   Assess/Plan: 1. ARVC - he has been asymptomatic. He will continue his current meds. 2. VT - he had a single episode around but just below his detection. I considered reprogramming his device to a lower rate of therapy. However, he has been asymptomatic so we will hold off.  3. ICD - his St. Jude device is working normally.  4. Dyslipidemia - his primary MD is following his statin therapy.  Leonia Reeves.D.

## 2018-05-25 NOTE — Patient Instructions (Signed)

## 2018-05-26 LAB — CUP PACEART INCLINIC DEVICE CHECK
Brady Statistic RV Percent Paced: 0.03 %
HIGH POWER IMPEDANCE MEASURED VALUE: 73.125
Implantable Lead Implant Date: 20160211
Implantable Lead Location: 753859
Implantable Lead Model: 7122
Implantable Pulse Generator Implant Date: 20160211
Lead Channel Impedance Value: 375 Ohm
Lead Channel Impedance Value: 525 Ohm
Lead Channel Pacing Threshold Amplitude: 0.75 V
Lead Channel Pacing Threshold Amplitude: 0.75 V
Lead Channel Pacing Threshold Pulse Width: 0.5 ms
Lead Channel Sensing Intrinsic Amplitude: 10 mV
Lead Channel Sensing Intrinsic Amplitude: 3.8 mV
Lead Channel Setting Pacing Amplitude: 2 V
Lead Channel Setting Pacing Amplitude: 2.5 V
Lead Channel Setting Pacing Pulse Width: 0.5 ms
MDC IDC LEAD IMPLANT DT: 20160211
MDC IDC LEAD LOCATION: 753860
MDC IDC MSMT BATTERY REMAINING LONGEVITY: 66 mo
MDC IDC MSMT LEADCHNL RA PACING THRESHOLD AMPLITUDE: 0.75 V
MDC IDC MSMT LEADCHNL RA PACING THRESHOLD AMPLITUDE: 0.75 V
MDC IDC MSMT LEADCHNL RA PACING THRESHOLD PULSEWIDTH: 0.5 ms
MDC IDC MSMT LEADCHNL RA PACING THRESHOLD PULSEWIDTH: 0.5 ms
MDC IDC MSMT LEADCHNL RV PACING THRESHOLD PULSEWIDTH: 0.5 ms
MDC IDC PG SERIAL: 7230141
MDC IDC SESS DTM: 20200102191743
MDC IDC SET LEADCHNL RV SENSING SENSITIVITY: 0.5 mV
MDC IDC STAT BRADY RA PERCENT PACED: 5.1 %

## 2018-05-26 NOTE — Addendum Note (Signed)
Addended by: Solon Augusta on: 05/26/2018 12:27 PM   Modules accepted: Orders

## 2018-06-09 ENCOUNTER — Other Ambulatory Visit: Payer: 59 | Admitting: *Deleted

## 2018-06-09 ENCOUNTER — Telehealth: Payer: Self-pay | Admitting: *Deleted

## 2018-06-09 DIAGNOSIS — I472 Ventricular tachycardia, unspecified: Secondary | ICD-10-CM

## 2018-06-09 NOTE — Telephone Encounter (Signed)
Spoke with patient. He is agreeable to lab work today. He reports his wife is working until [employer], but he will figure out a way to get to the office. [employer] office is closest to him per patient. He again verbalizes understanding of [place] DMV driving restrictions x6 months. Lab orders entered. Patient denies additional questions at this time.

## 2018-06-09 NOTE — Telephone Encounter (Signed)
LMOVM requesting call back to DC.  Received recommendations from Dr. Johney Frame to obtain labs today (BMET, Mg, TSH). No driving x6 months. Plan to review episodes with Dr. Ladona Ridgel next week.

## 2018-06-09 NOTE — Telephone Encounter (Signed)
Received Merlin alert for 4 VT episodes treated with ATP on 06/08/18. ATP was not successful in terminating VT, episode considered terminated by device as VT fell below detection rate. Upon review, also noted 18 "non-sustained" VT episodes--EGMs show VT was hovering around detection rate. First "non-sustained" episode noted on 06/07/18 at 1406, last episode on 06/08/18 at 1323. Presenting rhythm as of 06/09/18 as of 0320 shows NSR ~60bpm.  Spoke with patient, who reports feeling "terrible" on 1/15 and 1/16. He reports possible food poisoning (no one else in his family is sick), symptoms included vomiting and diarrhea. He reports it's the sickest he has ever felt. He reports that he thinks he was able to keep down his PM sotalol down yesterday, but isn't sure about the AM dose. He feels "great" this morning and took his sotalol.  Advised I will review episodes with an MD and call him back. Advised of [place] DMV driving restrictions x6 months. Patient verbalizes understanding of restrictions and is agreeable to this plan.

## 2018-06-10 LAB — BASIC METABOLIC PANEL
BUN/Creatinine Ratio: 21 — ABNORMAL HIGH (ref 9–20)
BUN: 29 mg/dL — ABNORMAL HIGH (ref 6–24)
CHLORIDE: 100 mmol/L (ref 96–106)
CO2: 21 mmol/L (ref 20–29)
Calcium: 9.8 mg/dL (ref 8.7–10.2)
Creatinine, Ser: 1.35 mg/dL — ABNORMAL HIGH (ref 0.76–1.27)
GFR calc Af Amer: 72 mL/min/{1.73_m2} (ref >59)
GFR calc non Af Amer: 62 mL/min/{1.73_m2} (ref >59)
Glucose: 100 mg/dL — ABNORMAL HIGH (ref 65–99)
Potassium: 4.1 mmol/L (ref 3.5–5.2)
Sodium: 138 mmol/L (ref 134–144)

## 2018-06-10 LAB — MAGNESIUM: Magnesium: 2.3 mg/dL (ref 1.6–2.3)

## 2018-06-10 LAB — TSH: TSH: 5.87 u[IU]/mL — ABNORMAL HIGH (ref 0.450–4.500)

## 2018-06-16 ENCOUNTER — Telehealth: Payer: Self-pay | Admitting: Internal Medicine

## 2018-06-16 NOTE — Telephone Encounter (Signed)
Pt saw Dr. Ladona Ridgel 05-25-18 and has a recall for 1 year. Pt is calling to make an appt with Ladona Ridgel due to lab work he had 06-09-18. And that his machine "went off" Wednesday. I haven't gotten anything stating her needs to be seen. Before I schedule him, is and when is pt due to be seen. Pls advise

## 2018-06-16 NOTE — Telephone Encounter (Signed)
Apt scheduled for pt to see Gypsy Balsam on 1/31@ 10:40am for device reprogramming.

## 2018-06-19 ENCOUNTER — Telehealth: Payer: Self-pay

## 2018-06-19 NOTE — Telephone Encounter (Signed)
Call placed to Pt.  Notified of abnormal results of TSH test.  Advised would send results to Pt's PCP.  Advised Pt to call PCP to discuss.  Pt indicates understanding.

## 2018-06-19 NOTE — Telephone Encounter (Signed)
-----   Message from Hillis Range, MD sent at 06/15/2018  9:18 PM EST ----- Results reviewed.  Boneta Lucks, please inform pt of result. He needs to follow-up with PCP for abnormal TSH which may be contributing to his VT.

## 2018-06-23 ENCOUNTER — Encounter: Payer: Self-pay | Admitting: Nurse Practitioner

## 2018-06-23 ENCOUNTER — Ambulatory Visit (INDEPENDENT_AMBULATORY_CARE_PROVIDER_SITE_OTHER): Payer: 59 | Admitting: Nurse Practitioner

## 2018-06-23 VITALS — Ht 70.0 in

## 2018-06-23 DIAGNOSIS — I472 Ventricular tachycardia, unspecified: Secondary | ICD-10-CM

## 2018-06-23 LAB — CUP PACEART INCLINIC DEVICE CHECK
Implantable Lead Implant Date: 20160211
Implantable Lead Implant Date: 20160211
Implantable Lead Location: 753859
Implantable Lead Location: 753860
Implantable Lead Model: 7122
Implantable Pulse Generator Implant Date: 20160211
MDC IDC SESS DTM: 20200131105611
Pulse Gen Serial Number: 7230141

## 2018-06-23 NOTE — Progress Notes (Signed)
Device check in clinic. Programming changes per Dr Ladona Ridgel. VT zone decreased to 171 bpm. ATP only. VT2 zone at 190bpm (ATP and shocks). VF zone at 214bpm.

## 2018-06-23 NOTE — Patient Instructions (Signed)
Medication Instructions:  none If you need a refill on your cardiac medications before your next appointment, please call your pharmacy.   Lab work: none If you have labs (blood work) drawn today and your tests are completely normal, you will receive your results only by: . MyChart Message (if you have MyChart) OR . A paper copy in the mail If you have any lab test that is abnormal or we need to change your treatment, we will call you to review the results.  Testing/Procedures: none  Follow-Up: At [facility], you and your health needs are our priority.  As part of our continuing mission to provide you with exceptional heart care, we have created designated Provider Care Teams.  These Care Teams include your primary Cardiologist (physician) and Advanced Practice Providers (APPs -  Physician Assistants and Nurse Practitioners) who all work together to provide you with the care you need, when you need it. .   Any Other Special Instructions Will Be Listed Below (If Applicable).    

## 2018-06-30 ENCOUNTER — Other Ambulatory Visit: Payer: Self-pay | Admitting: Internal Medicine

## 2018-07-02 ENCOUNTER — Observation Stay (HOSPITAL_COMMUNITY)
Admission: EM | Admit: 2018-07-02 | Discharge: 2018-07-03 | Disposition: A | Discharge status: Home / Self Care | Payer: 59 | Attending: Cardiology | Admitting: Cardiology

## 2018-07-02 ENCOUNTER — Emergency Department (HOSPITAL_COMMUNITY): Payer: 59

## 2018-07-02 ENCOUNTER — Encounter (HOSPITAL_COMMUNITY): Payer: Self-pay | Admitting: Emergency Medicine

## 2018-07-02 ENCOUNTER — Other Ambulatory Visit: Payer: Self-pay

## 2018-07-02 DIAGNOSIS — G709 Myoneural disorder, unspecified: Secondary | ICD-10-CM | POA: Diagnosis not present

## 2018-07-02 DIAGNOSIS — I429 Cardiomyopathy, unspecified: Secondary | ICD-10-CM | POA: Insufficient documentation

## 2018-07-02 DIAGNOSIS — R0789 Other chest pain: Principal | ICD-10-CM | POA: Insufficient documentation

## 2018-07-02 DIAGNOSIS — R5383 Other fatigue: Secondary | ICD-10-CM | POA: Diagnosis not present

## 2018-07-02 DIAGNOSIS — R778 Other specified abnormalities of plasma proteins: Secondary | ICD-10-CM

## 2018-07-02 DIAGNOSIS — R7989 Other specified abnormal findings of blood chemistry: Secondary | ICD-10-CM

## 2018-07-02 DIAGNOSIS — R001 Bradycardia, unspecified: Secondary | ICD-10-CM | POA: Diagnosis not present

## 2018-07-02 DIAGNOSIS — I10 Essential (primary) hypertension: Secondary | ICD-10-CM | POA: Insufficient documentation

## 2018-07-02 DIAGNOSIS — Z87891 Personal history of nicotine dependence: Secondary | ICD-10-CM | POA: Diagnosis not present

## 2018-07-02 DIAGNOSIS — Z95810 Presence of automatic (implantable) cardiac defibrillator: Secondary | ICD-10-CM | POA: Insufficient documentation

## 2018-07-02 DIAGNOSIS — Z7982 Long term (current) use of aspirin: Secondary | ICD-10-CM | POA: Diagnosis not present

## 2018-07-02 DIAGNOSIS — E785 Hyperlipidemia, unspecified: Secondary | ICD-10-CM | POA: Insufficient documentation

## 2018-07-02 DIAGNOSIS — Z79899 Other long term (current) drug therapy: Secondary | ICD-10-CM | POA: Diagnosis not present

## 2018-07-02 DIAGNOSIS — R079 Chest pain, unspecified: Secondary | ICD-10-CM | POA: Diagnosis not present

## 2018-07-02 LAB — CBC
HCT: 47.9 % (ref 39.0–52.0)
Hemoglobin: 15.9 g/dL (ref 13.0–17.0)
MCH: 30 pg (ref 26.0–34.0)
MCHC: 33.2 g/dL (ref 30.0–36.0)
MCV: 90.4 fL (ref 80.0–100.0)
NRBC: 0 % (ref 0.0–0.2)
Platelets: 214 10*3/uL (ref 150–400)
RBC: 5.3 MIL/uL (ref 4.22–5.81)
RDW: 13.7 % (ref 11.5–15.5)
WBC: 9.6 10*3/uL (ref 4.0–10.5)

## 2018-07-02 LAB — BASIC METABOLIC PANEL
Anion gap: 10 (ref 5–15)
BUN: 11 mg/dL (ref 6–20)
CO2: 22 mmol/L (ref 22–32)
Calcium: 10.1 mg/dL (ref 8.9–10.3)
Chloride: 105 mmol/L (ref 98–111)
Creatinine, Ser: 0.92 mg/dL (ref 0.61–1.24)
GFR calc Af Amer: >60 mL/min (ref >60)
GFR calc non Af Amer: >60 mL/min (ref >60)
Glucose, Bld: 113 mg/dL — ABNORMAL HIGH (ref 70–99)
Potassium: 4.1 mmol/L (ref 3.5–5.1)
Sodium: 137 mmol/L (ref 135–145)

## 2018-07-02 LAB — T4, FREE: Free T4: 0.8 ng/dL — ABNORMAL LOW (ref 0.82–1.77)

## 2018-07-02 LAB — I-STAT TROPONIN, ED
Troponin i, poc: 0 ng/mL (ref 0.00–0.08)
Troponin i, poc: 0.11 ng/mL (ref 0.00–0.08)

## 2018-07-02 LAB — SAMPLE TO BLOOD BANK

## 2018-07-02 LAB — TSH: TSH: 5.3 u[IU]/mL — ABNORMAL HIGH (ref 0.350–4.500)

## 2018-07-02 LAB — TROPONIN I: Troponin I: <0.03 ng/mL (ref <0.03)

## 2018-07-02 MED ORDER — ATORVASTATIN CALCIUM 10 MG PO TABS
20.0000 mg | ORAL_TABLET | Freq: Every day | ORAL | Status: DC
  Administered 2018-07-03: 20 mg via ORAL
  Filled 2018-07-02: qty 2

## 2018-07-02 MED ORDER — IOPAMIDOL (ISOVUE-370) INJECTION 76%
75.0000 mL | Freq: Once | INTRAVENOUS | Status: AC | PRN
  Administered 2018-07-02: 75 mL via INTRAVENOUS

## 2018-07-02 MED ORDER — ADULT MULTIVITAMIN W/MINERALS CH
1.0000 | ORAL_TABLET | Freq: Every day | ORAL | Status: DC
  Filled 2018-07-02: qty 1

## 2018-07-02 MED ORDER — ASPIRIN 81 MG PO CHEW
324.0000 mg | CHEWABLE_TABLET | Freq: Once | ORAL | Status: AC
  Administered 2018-07-02: 324 mg via ORAL
  Filled 2018-07-02: qty 4

## 2018-07-02 MED ORDER — ONDANSETRON HCL 4 MG/2ML IJ SOLN: 4.0000 mg | Freq: Four times a day (QID) | INTRAMUSCULAR | Status: DC | PRN

## 2018-07-02 MED ORDER — IOPAMIDOL (ISOVUE-370) INJECTION 76%
INTRAVENOUS | Status: AC
  Administered 2018-07-03: 07:00:00
  Filled 2018-07-02: qty 100

## 2018-07-02 MED ORDER — HEPARIN BOLUS VIA INFUSION
4000.0000 [IU] | Freq: Once | INTRAVENOUS | Status: AC
  Administered 2018-07-02: 4000 [IU] via INTRAVENOUS
  Filled 2018-07-02: qty 4000

## 2018-07-02 MED ORDER — ASPIRIN EC 81 MG PO TBEC
81.0000 mg | DELAYED_RELEASE_TABLET | Freq: Every day | ORAL | Status: DC
  Administered 2018-07-03: 81 mg via ORAL
  Filled 2018-07-02: qty 1

## 2018-07-02 MED ORDER — ASPIRIN 81 MG PO CHEW
324.0000 mg | CHEWABLE_TABLET | ORAL | Status: DC
  Filled 2018-07-02: qty 4

## 2018-07-02 MED ORDER — SODIUM CHLORIDE 0.9% FLUSH
3.0000 mL | Freq: Once | INTRAVENOUS | Status: AC
  Administered 2018-07-02: 3 mL via INTRAVENOUS

## 2018-07-02 MED ORDER — ASPIRIN 300 MG RE SUPP: 300.0000 mg | RECTAL | Status: DC

## 2018-07-02 MED ORDER — SOTALOL HCL 120 MG PO TABS
120.0000 mg | ORAL_TABLET | Freq: Two times a day (BID) | ORAL | Status: DC
  Administered 2018-07-02: 120 mg via ORAL
  Filled 2018-07-02 (×3): qty 1

## 2018-07-02 MED ORDER — HEPARIN (PORCINE) 25000 UT/250ML-% IV SOLN
1500.0000 [IU]/h | INTRAVENOUS | Status: DC
  Administered 2018-07-02: 1100 [IU]/h via INTRAVENOUS
  Filled 2018-07-02: qty 250

## 2018-07-02 MED ORDER — NITROGLYCERIN 0.4 MG SL SUBL: 0.4000 mg | SUBLINGUAL_TABLET | SUBLINGUAL | Status: DC | PRN

## 2018-07-02 MED ORDER — ACETAMINOPHEN 325 MG PO TABS: 650.0000 mg | ORAL_TABLET | ORAL | Status: DC | PRN

## 2018-07-02 NOTE — ED Notes (Signed)
Per [employer]. Jude, no events noted on pacemaker since Jan 31 when he was last seen.

## 2018-07-02 NOTE — ED Notes (Signed)
Pt denies cp at this time

## 2018-07-02 NOTE — ED Provider Notes (Signed)
[facility] EMERGENCY DEPARTMENT Provider Note   CSN: [id number] Arrival date & time: 07/02/18  1613     History   Chief Complaint Chief Complaint  Patient presents with  . Chest Pain    HPI Essa Handshoe is a 48 y.o. male.  HPI   Kix Greenhagen is a 48 y.o. 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  The fatigue has been worsening over the last several days to weeks.  Reports he had changes to his defibrillator in January and has not been able to work since Tuesday last week.  On Friday started developing intermittent "falling asleep sensations" in his hands and feet.  This would intermittently be the left hand and then another time the right foot or vice versa.  Not consistent with any positions or movements.  No vertigo or lightheadedness or numbness or weakness.  He then developed chest discomfort on Friday evening at rest which has been constant now for about 36 hours.  He states the pain started in his left mid chest and was an achy sensation.  Since then it is radiated toward his left shoulder and left back which he noticed with this morning when he woke up.  This afternoon it moved toward his left lateral neck.  He has become to get more short of breath in the last 2 days and has shortness of breath with exertion.  He was short of breath enough after walking out of church and his friends commented on it.  Mild cough but nonproductive.  No fever or chills.  Normal appetite.  Past Medical History:  Diagnosis Date  . Arrhythmogenic right ventricular dysplasia (HCC)    a. diagnosed by MRI 06/2014 following presentation with hemodynamically unstable VT  . Hyperlipidemia   . Hypertension   . 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  . VT (ventricular tachycardia) (HCC)    a. 06/2014 ARVD s/p SJM dc AICD.-Dr. Rosette Reveal follows    Patient Active Problem List   Diagnosis Date Noted  . Chest pain 07/02/2018  . Overweight (BMI 25.0-29.9) 07/09/2015  . S/P right THA, AA 07/08/2015  . ICD (implantable cardioverter-defibrillator) in place 10/08/2014  . VT (ventricular tachycardia) (HCC) 07/04/2014  . 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 Ventricular tachycardia (HCC) 06/30/2014    Past Surgical History:  Procedure Laterality Date  . arm surgery  1991   2 steel plates in left arm  . ELECTROPHYSIOLOGY STUDY N/A 07/03/2014   EPS by Dr Graciela Husbands with inducible VT  . IMPLANTABLE CARDIOVERTER DEFIBRILLATOR IMPLANT N/A 07/04/2014   STJ dual chamber ICD implanted by Dr Ladona Ridgel for secondary prevention  . LEFT HEART CATHETERIZATION WITH CORONARY ANGIOGRAM N/A 07/02/2014   no CAD, normal LV function  . TOTAL HIP ARTHROPLASTY Right 07/08/2015   Procedure: RIGHT TOTAL HIP ARTHROPLASTY ANTERIOR APPROACH;  Surgeon: Durene Romans, MD;  Location: [facility];  Service: Orthopedics;  Laterality: Right;  Marland Kitchen VASECTOMY          Home Medications    Prior to Admission medications   Medication Sig Start Date End Date Taking? Authorizing Provider  aspirin EC 81 MG tablet Take 162 mg by mouth daily as needed (pain/headache/discomfort).   Yes [provider]  atorvastatin (LIPITOR) 20 MG  tablet Take 20 mg by mouth daily.  04/02/18  Yes [provider]  Multiple Vitamin (MULTIVITAMIN WITH MINERALS) TABS tablet Take 1 tablet by mouth daily.   Yes [provider]  SOTALOL AF 120 MG TABS TAKE 1 TABLET BY MOUTH TWICE DAILY. KEEP APPOINTMENT FOR FURTHER REFILLS Patient taking differently: Take 120 mg by mouth 2 (two) times daily.  05/02/18  Yes Marinus Maw, MD    Family History Family History  Problem Relation Age of Onset  . Alcohol abuse Mother   . Mental  illness Father        committed suicide    Social History Social History   Tobacco Use  . Smoking status: Former Smoker    Years: 5.00    Types: Cigarettes    Last attempt to quit: 06/23/2013    Years since quitting: 5.0  . Smokeless tobacco: Never Used  Substance Use Topics  . Alcohol use: No  . Drug use: No     Allergies   Patient has no known allergies.   Review of Systems Review of Systems  Constitutional: Positive for activity change and fatigue. Negative for appetite change, chills and fever.  HENT: Negative for ear pain and sore throat.   Eyes: Negative for pain and visual disturbance.  Respiratory: Positive for shortness of breath. Negative for cough.   Cardiovascular: Positive for chest pain. Negative for palpitations.  Gastrointestinal: Negative for abdominal pain and vomiting.  Genitourinary: Negative for dysuria and hematuria.  Musculoskeletal: Positive for arthralgias. Negative for back pain.  Skin: Negative for color change and rash.  Neurological: Negative for seizures and syncope.  All other systems reviewed and are negative.    Physical Exam Updated Vital Signs BP (!) 124/92 (BP Location: Right Arm)   Pulse (!) 56   Temp 98.4 F (36.9 C) (Oral)   Resp 18   Wt 92.1 kg   SpO2 96%   BMI 29.13 kg/m   Physical Exam   ED Treatments / Results  Labs (all labs ordered are listed, but only abnormal results are displayed) Labs Reviewed  BASIC METABOLIC PANEL - Abnormal; Notable for the following components:      Result Value   Glucose, Bld 113 (*)    All other components within normal limits  TSH - Abnormal; Notable for the following components:   TSH 5.300 (*)    All other components within normal limits  T4, FREE - Abnormal; Notable for the following components:   Free T4 0.80 (*)    All other components within normal limits  I-STAT TROPONIN, ED - Abnormal; Notable for the following components:   Troponin i, poc 0.11 (*)    All other  components within normal limits  CBC  HEPARIN LEVEL (UNFRACTIONATED)  HEPARIN LEVEL (UNFRACTIONATED)  CBC  HIV ANTIBODY (ROUTINE TESTING W REFLEX)  TROPONIN I  TROPONIN I  TROPONIN I  BASIC METABOLIC PANEL  PROTIME-INR  LIPID PANEL  I-STAT TROPONIN, ED  I-STAT TROPONIN, ED  SAMPLE TO BLOOD BANK    EKG EKG Interpretation  Date/Time:  Sunday July 02 2018 16:18:36 EST Ventricular Rate:  57 PR Interval:  168 QRS Duration: 102 QT Interval:  408 QTC Calculation: 397 R Axis:   68 Text Interpretation:  Sinus bradycardia Nonspecific ST and T wave abnormality Abnormal ECG Confirmed by Lorre Nick ([id number]) on 07/02/2018 6:18:17 PM   Radiology Dg Chest 2 View  Result Date: 07/02/2018 CLINICAL DATA:  48 year old male with history of chest discomfort. EXAM:  CHEST - 2 VIEW COMPARISON:  Chest x-ray 07/05/2014. FINDINGS: Lung volumes are normal. No consolidative airspace disease. No pleural effusions. No pneumothorax. No pulmonary nodule or mass noted. Pulmonary vasculature and the cardiomediastinal silhouette are within normal limits. Left-sided pacemaker/AICD device in place with lead tips projecting over the expected location of the left ventricle and right ventricular apex. IMPRESSION: No radiographic evidence of acute cardiopulmonary disease. Electronically Signed   By: Trudie Reed M.D.   On: 07/02/2018 18:20   Ct Angio Chest Pe W And/or Wo Contrast  Result Date: 07/02/2018 CLINICAL DATA:  Constant left-sided chest pain and shortness of breath. EXAM: CT ANGIOGRAPHY CHEST WITH CONTRAST TECHNIQUE: Multidetector CT imaging of the chest was performed using the standard protocol during bolus administration of intravenous contrast. Multiplanar CT image reconstructions and MIPs were obtained to evaluate the vascular anatomy. CONTRAST:  77mL ISOVUE-370 IOPAMIDOL (ISOVUE-370) INJECTION 76% COMPARISON:  Chest radiograph 07/02/2018 FINDINGS: Cardiovascular: Normal heart size. Aorta and main  pulmonary artery normal in caliber. Adequate opacification of the pulmonary arterial system. Motion artifact limits evaluation. No filling defect identified to suggest acute pulmonary embolus. Mediastinum/Nodes: Prominent subcentimeter mediastinal lymph nodes. Normal appearance of the esophagus. No axillary adenopathy. Lungs/Pleura: Central airways are patent. Dependent atelectasis within the right middle and right lower lobes. No large area pulmonary consolidation. No pleural effusion or pneumothorax. Upper Abdomen: No acute process. Musculoskeletal: Thoracic spine degenerative changes. No aggressive or acute appearing osseous lesions. Review of the MIP images confirms the above findings. IMPRESSION: No acute pulmonary embolus. No acute process within the chest. Electronically Signed   By: Annia Belt M.D.   On: 07/02/2018 19:48    Procedures Procedures (including critical care time)  Medications Ordered in ED Medications  iopamidol (ISOVUE-370) 76 % injection (has no administration in time range)  heparin bolus via infusion 4,000 Units (has no administration in time range)  heparin ADULT infusion 100 units/mL (25000 units/222mL sodium chloride 0.45%) (has no administration in time range)  aspirin chewable tablet 324 mg (has no administration in time range)    Or  aspirin suppository 300 mg (has no administration in time range)  aspirin EC tablet 81 mg (has no administration in time range)  nitroGLYCERIN (NITROSTAT) SL tablet 0.4 mg (has no administration in time range)  acetaminophen (TYLENOL) tablet 650 mg (has no administration in time range)  ondansetron (ZOFRAN) injection 4 mg (has no administration in time range)  atorvastatin (LIPITOR) tablet 20 mg (has no administration in time range)  multivitamin with minerals tablet 1 tablet (has no administration in time range)  sotalol (BETAPACE) tablet 120 mg (has no administration in time range)  sodium chloride flush (NS) 0.9 % injection 3 mL (3  mLs Intravenous Given 07/02/18 1723)  iopamidol (ISOVUE-370) 76 % injection 75 mL (75 mLs Intravenous Contrast Given 07/02/18 1927)  aspirin chewable tablet 324 mg (324 mg Oral Given 07/02/18 1950)     Initial Impression / Assessment and Plan / ED Course  I have reviewed the triage vital signs and the nursing notes.  Pertinent labs & imaging results that were available during my care of the patient were reviewed by me and considered in my medical decision making (see chart for details).     MDM:  Imaging: CXR unremarkable.   ED Provider Interpretation of EKG: Sinus bradycardia with a rate of 57 bpm, normal axis, no ST segment elevation or depression.  Nonspecific T wave abnormality which may be artifactual in leads II and III, subtle biphasic T  wave in V3 which is seen on prior EKG.  Labs: CBC WNL, troponin undetectable, TSH 5.3 and free T4 slightly low at 0.8  On initial evaluation, patient appears well. Afebrile and hemodynamically stable although intermittently bradycardic in the high 50s. Alert and oriented x4, pleasant, and cooperative.  Presents with chest pain as detailed above.  Last echo February 2016 showing LVEF of 55 to 60% with mild concentric hypertrophy and no wall motion abnormality.  Diastolic parameters normal.  Last saw EP on January 31 where his device was checked and parameters for VT/VF were changed.  On exam, patient has no focal abnormality.  Chest pain is not reproducible.  EKG shows no evidence for acute ischemia as above.  Troponin undetectable on initial.  Pain has been constant for about 36 hours and he has no known history of CAD.  However, his delta troponin elevated at 0.11.  No tachycardia and satting high 90s to 100% on room air without increased work of breathing.  As he is has persistent dyspnea and chest discomfort without another clear source for his pathology PE study was performed showing no acute pathology.  EKG inconsistent with pericarditis and story is  inconsistent with myocarditis.  Chest x-ray shows no evidence for pneumonia or pneumothorax.  Cardiac silhouette is normal size.  Heart sounds normal.  Low suspicion for pericardial effusion.  ICD in place with leads that are intact.  Merlin ICD interrogated by nursing.  Mildly hypothyroid which may explain some of his fatigue with concern for underlying cardiomyopathy that may have worsened since interval ultrasound in 2016.  He has not had an echo recently that is accessible in care everywhere or on chart review.  Suspect the patient's troponin is slightly elevated in the setting of remodeling secondary to ARVD induced cardiomyopathy although he was given full dose of aspirin.  Heparin started.  Repeat troponin pending.  Cardiology consulted and admitted patient to their service for further management.  The plan for this patient was discussed with Dr. Freida BusmanAllen who voiced agreement and who oversaw evaluation and treatment of this patient.   The patient was fully informed and involved with the history taking, evaluation, workup including labs/images, and plan. The patient's concerns and questions were addressed to the patient's satisfaction and he expressed agreement with the plan to admit.    Final Clinical Impressions(s) / ED Diagnoses   Final diagnoses:  Elevated troponin  Nonspecific chest pain    ED Discharge Orders    None       Vashti Bolanos, Sherryle LisJames F II, MD 07/02/18 2229    Lorre NickAllen, Anthony, MD 07/03/18 1332

## 2018-07-02 NOTE — H&P (Signed)
CARDIOLOGY HISTORY AND PHYSICAL  Chief Complaint  Patient presents with  . Chest Pain    HPI: James Fowler is a 48 y.o. 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  He has h/o cath showing normal cors in 2016. The fatigue has been worsening over the last several days to weeks.  Reports he had changes to his defibrillator in January and has not been able to work since Tuesday last week.  On Friday started developing intermittent "falling asleep sensations" in his hands and feet.  This would intermittently be the left hand and then another time the right foot or vice versa.  Not consistent with any positions or movements.  No vertigo or lightheadedness or numbness or weakness.  He then developed chest discomfort on Friday evening at rest which has been constant now for about 36 hours (with 1st trop normal this evening, repeat 0.11; he has h/o abnormal trop in the past with 0.53 back in Feb 2016 at time of his diagnosis ARVD--when cath showed clean cors).  He states the pain started in his left mid chest and was an achy sensation.  Since then it is radiated toward his left shoulder and left back which he noticed with this morning when he woke up.  This afternoon it moved toward his left lateral neck.  He has become to get more short of breath in the last 2 days and has shortness of breath with exertion.  He was short of breath enough after walking out of church and his friends commented on it.  Mild cough but nonproductive.  No fever or chills.  Normal appetite.  Past Medical History:  Diagnosis Date  . Arrhythmogenic right ventricular dysplasia (HCC)    a. diagnosed by MRI 06/2014 following presentation with hemodynamically unstable VT  . Hyperlipidemia   . Hypertension   .  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  . VT (ventricular tachycardia) (HCC)    a. 06/2014 ARVD s/p SJM dc AICD.-Dr. Rosette Reveal follows    Past Surgical History:  Procedure Laterality Date  . arm surgery  1991   2 steel plates in left arm  . ELECTROPHYSIOLOGY STUDY N/A 07/03/2014   EPS by Dr Graciela Husbands with inducible VT  . IMPLANTABLE CARDIOVERTER DEFIBRILLATOR IMPLANT N/A 07/04/2014   STJ dual chamber ICD implanted by Dr Ladona Ridgel for secondary prevention  . LEFT HEART CATHETERIZATION WITH CORONARY ANGIOGRAM N/A 07/02/2014   no CAD, normal LV function  . TOTAL HIP ARTHROPLASTY Right 07/08/2015   Procedure: RIGHT TOTAL HIP ARTHROPLASTY ANTERIOR APPROACH;  Surgeon: Durene Romans, MD;  Location: [facility];  Service: Orthopedics;  Laterality: Right;  Marland Kitchen VASECTOMY       Current Facility-Administered Medications:  .  heparin ADULT infusion 100 units/mL (25000 units/242mL sodium chloride 0.45%), 1,100 Units/hr, Intravenous, Continuous, Daylene Posey, RPH .  heparin bolus via infusion 4,000 Units, 4,000 Units, Intravenous, Once, Daylene Posey, RPH .  iopamidol (ISOVUE-370) 76 % injection, , , ,   Current Outpatient Medications:  .  aspirin EC 81 MG tablet, Take 162 mg by mouth daily as needed (pain/headache/discomfort)., Disp: , Rfl:  .  atorvastatin (LIPITOR) 20 MG tablet, Take 20 mg by mouth daily. , Disp: , Rfl:  .  Multiple Vitamin (MULTIVITAMIN WITH MINERALS) TABS tablet, Take 1  tablet by mouth daily., Disp: , Rfl:  .  SOTALOL AF 120 MG TABS, TAKE 1 TABLET BY MOUTH TWICE DAILY. KEEP APPOINTMENT FOR FURTHER REFILLS (Patient taking differently: Take 120 mg by mouth 2 (two) times daily. ), Disp: 180 tablet, Rfl: 0 Current Meds  Medication Sig  . aspirin EC 81 MG tablet Take 162 mg by mouth daily as needed (pain/headache/discomfort).  Marland Kitchen atorvastatin (LIPITOR) 20 MG tablet Take 20 mg by mouth daily.   . Multiple Vitamin (MULTIVITAMIN WITH MINERALS) TABS  tablet Take 1 tablet by mouth daily.  Marland Kitchen SOTALOL AF 120 MG TABS TAKE 1 TABLET BY MOUTH TWICE DAILY. KEEP APPOINTMENT FOR FURTHER REFILLS (Patient taking differently: Take 120 mg by mouth 2 (two) times daily. )    Allergies: No Known Allergies  Social History:  The patient  reports that he quit smoking about 5 years ago. His smoking use included cigarettes. He quit after 5.00 years of use. He has never used smokeless tobacco. He reports that he does not drink alcohol or use drugs.   Family History:  The patient's family history includes Alcohol abuse in his mother; Mental illness in his father.   ROS:  Please see the history of present illness.     All other systems reviewed and negative.   PHYSICAL EXAM: BP 120/88   Pulse (!) 52   Temp 98.4 F (36.9 C) (Oral)   Resp 13   Wt 92.1 kg   SpO2 95%   BMI 29.13 kg/m   General:  Well nourished, well developed, in no acute distress HEENT: normal Lymph: no adenopathy Neck: no JVD Endocrine:  No thryomegaly Vascular: No carotid bruits; DP pulses 2+ bilaterally  Cardiac:  normal S1, S2; RRR; no murmur  Lungs:  clear to auscultation bilaterally, no wheezing, rhonchi or rales  Abd: soft, nontender, no hepatomegaly  Ext: no edema Musculoskeletal:  No deformities Skin: warm and dry  Neuro:  CNs 2-12 intact, no focal abnormalities noted Psych:  Normal affect   Labs:   Lab Results  Component Value Date   WBC 9.6 07/02/2018   HGB 15.9 07/02/2018   HCT 47.9 07/02/2018   MCV 90.4 07/02/2018   PLT 214 07/02/2018   Recent Labs  Lab 07/02/18 1634  NA 137  K 4.1  CL 105  CO2 22  BUN 11  CREATININE 0.92  CALCIUM 10.1  GLUCOSE 113*   No results for input(s): CKTOTAL, CKMB, TROPONINI in the last 72 hours. Troponin (Point of Care Test) Recent Labs    07/02/18 1910  TROPIPOC 0.11*    No results found for: CHOL, HDL, LDLCALC, TRIG No results found for: DDIMER    EKG:  SB, 57. Nonspecific ST changes  CARDIAC HISTORY:  LHC  07-02-14 HEMODYNAMICS:   Central Aorta: 105/70  Left Ventricle: 105/7  ANGIOGRAPHY:   The left main coronary artery was angiographically normal and bifurcated into the LAD and left circumflex coronary artery.   The LAD was angiographically normal and gave rise to 2 major diagonal vessels and several septal perforating arteries. The vessel extended to the LV apex.   The left circumflex coronary artery was angiographically normal and gave rise to one major bifurcating obtuse marginal branch.   The RCA was angiographically normal it gave rise to a large PDA and PLA vessel.   Left ventriculography revealed normal global LV contractility without focal segmental wall motion abnormalities. There was no evidence for mitral regurgitation.  Ejection fraction is a approximately 55-60%.  Total contrast used: 70 cc Omipaque  IMPRESSION: Normal left ventricular function. Normal coronary arteries.   cMRI 07-03-14 IMPRESSION: 1.Normal left ventricular size, thickness and systolic function (LVEF = 52%) with no regional wall motion abnormalities.  There is no late gadolinium enhancement in the left ventricular myocardium and no other evidence suggestive of infiltrative or inflammatory diseases such as sarcoidosis.  2. Mildly dilated right ventricle with normal wall thickness and mildly impaired systolic function (RVEF = 38 %) with wall motion abnormalities that include a RV free wall dyskinesis and a basal wall outpounching.  Conclusively, these findings are consistent with a diagnosis of arrhythmogenic right ventricular cardiomyopathy.  3.Trace mitral and mild regurgitation.  ASSESSMENT AND PLAN:   1. CP: very atypical. He has had 3 days of constant pain w/ minimal trop elevation. Device needs interrogation to make sure this is not recurrent VT although pt says he has not noticed significant palpitations, and device has not delivered any clear therapies. Would cont to  cycle enzymes; obst CAD less likely given h/o normal cors but he may need repeat ischemia eval depending on which way trop trends. Ok to cont heparin until we have another set of enzymes to assess trend. Will get updated TTE.  2. HTN/dyslipidemia: restart home meds  Thank you for the opportunity to participate in the care of this patient.  For questions or updates, please contact [facility] Please consult [email or web address] for contact info under   Precious Reel, MD , [facility] 07/02/18 8:23 PM

## 2018-07-02 NOTE — ED Triage Notes (Signed)
Pt reports shortness of breath, right chest pain radiating to his neck and shoulder blade since friday. Pt has a merlin defibrillator and reports last months while he was sick he had episode where it went off. Denies any current problems.

## 2018-07-02 NOTE — ED Provider Notes (Signed)
I saw and evaluated the patient, reviewed the resident's note and I agree with the findings and plan.  EKG: EKG Interpretation  Date/Time:  Sunday July 02 2018 16:18:36 EST Ventricular Rate:  57 PR Interval:  168 QRS Duration: 102 QT Interval:  408 QTC Calculation: 397 R Axis:   68 Text Interpretation:  Sinus bradycardia Nonspecific ST and T wave abnormality Abnormal ECG Confirmed by Lorre Nick ([id number]) on 07/02/2018 6:17:70 PM 48 year old male presents with several days of constant left-sided chest discomfort with associated dyspnea.  Symptoms now made to his neck and shoulder.  Notes increased dyspnea exertion.  Chest x-ray here without acute findings.  Concern for possible PE or chest CT.   Lorre Nick, MD 07/02/18 [phone]

## 2018-07-02 NOTE — ED Notes (Signed)
Pt in bed and eating sandwich. Food ok to consume per Dr. Freida Busman.

## 2018-07-02 NOTE — Progress Notes (Signed)
ANTICOAGULATION CONSULT NOTE - Initial Consult  Pharmacy Consult for heparin Indication: chest pain/ACS  No Known Allergies  Patient Measurements: Weight: 203 lb (92.1 kg) Heparin Dosing Weight: 92kg  Vital Signs: Temp: 98.4 F (36.9 C) (02/09 1627) Temp Source: Oral (02/09 1627) BP: 120/88 (02/09 1800) Pulse Rate: 52 (02/09 1800)  Labs: Recent Labs    07/02/18 1634  HGB 15.9  HCT 47.9  PLT 214  CREATININE 0.92    Estimated Creatinine Clearance: 113.2 mL/min (by C-G formula based on SCr of 0.92 mg/dL).   Medical History: Past Medical History:  Diagnosis Date  . Arrhythmogenic right ventricular dysplasia (HCC)    a. diagnosed by MRI 06/2014 following presentation with hemodynamically unstable VT  . Hyperlipidemia   . Hypertension   . 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  . VT (ventricular tachycardia) (HCC)    a. 06/2014 ARVD s/p SJM dc AICD.-Dr. Rosette Reveal follows    Assessment: 52 YOM presenting with CP and pharmacy consulted to initiate heparin, hx of ICD, no anticoagulation PTA, CBC wnl. Goal of Therapy:  Heparin level 0.3-0.7 units/ml Monitor platelets by anticoagulation protocol: Yes   Plan:  Heparin 4000 units x 1 , and gtt at 1100 units/hr F/u 6 hour heparin level Daily heparin level, CBC, s/s bleeding  Daylene Posey, PharmD Clinical Pharmacist Please check AMION for all [facility] numbers 07/02/2018 8:05 PM

## 2018-07-03 ENCOUNTER — Ambulatory Visit (HOSPITAL_BASED_OUTPATIENT_CLINIC_OR_DEPARTMENT_OTHER): Payer: 59

## 2018-07-03 ENCOUNTER — Ambulatory Visit (INDEPENDENT_AMBULATORY_CARE_PROVIDER_SITE_OTHER): Payer: 59

## 2018-07-03 ENCOUNTER — Observation Stay (HOSPITAL_BASED_OUTPATIENT_CLINIC_OR_DEPARTMENT_OTHER): Payer: 59

## 2018-07-03 DIAGNOSIS — I10 Essential (primary) hypertension: Secondary | ICD-10-CM | POA: Diagnosis not present

## 2018-07-03 DIAGNOSIS — E785 Hyperlipidemia, unspecified: Secondary | ICD-10-CM | POA: Diagnosis not present

## 2018-07-03 DIAGNOSIS — I472 Ventricular tachycardia, unspecified: Secondary | ICD-10-CM

## 2018-07-03 DIAGNOSIS — R0602 Shortness of breath: Secondary | ICD-10-CM | POA: Diagnosis not present

## 2018-07-03 DIAGNOSIS — R7989 Other specified abnormal findings of blood chemistry: Secondary | ICD-10-CM | POA: Diagnosis not present

## 2018-07-03 DIAGNOSIS — R079 Chest pain, unspecified: Secondary | ICD-10-CM

## 2018-07-03 DIAGNOSIS — R0789 Other chest pain: Secondary | ICD-10-CM | POA: Diagnosis not present

## 2018-07-03 DIAGNOSIS — Z95810 Presence of automatic (implantable) cardiac defibrillator: Secondary | ICD-10-CM | POA: Diagnosis not present

## 2018-07-03 LAB — CBC
HCT: 45.7 % (ref 39.0–52.0)
Hemoglobin: 15.7 g/dL (ref 13.0–17.0)
MCH: 30.4 pg (ref 26.0–34.0)
MCHC: 34.4 g/dL (ref 30.0–36.0)
MCV: 88.6 fL (ref 80.0–100.0)
Platelets: 201 10*3/uL (ref 150–400)
RBC: 5.16 MIL/uL (ref 4.22–5.81)
RDW: 13.7 % (ref 11.5–15.5)
WBC: 7.9 10*3/uL (ref 4.0–10.5)
nRBC: 0 % (ref 0.0–0.2)

## 2018-07-03 LAB — HEPARIN LEVEL (UNFRACTIONATED)
Heparin Unfractionated: 0.37 IU/mL (ref 0.30–0.70)
Heparin Unfractionated: 0.29 IU/mL — ABNORMAL LOW (ref 0.30–0.70)
Heparin Unfractionated: 0.32 IU/mL (ref 0.30–0.70)

## 2018-07-03 LAB — LIPID PANEL
CHOLESTEROL: 147 mg/dL (ref 0–200)
HDL: 29 mg/dL — ABNORMAL LOW (ref >40)
LDL Cholesterol: 85 mg/dL (ref 0–99)
Total CHOL/HDL Ratio: 5.1 RATIO
Triglycerides: 166 mg/dL — ABNORMAL HIGH (ref <150)
VLDL: 33 mg/dL (ref 0–40)

## 2018-07-03 LAB — BASIC METABOLIC PANEL
Anion gap: 6 (ref 5–15)
BUN: 11 mg/dL (ref 6–20)
CALCIUM: 9.4 mg/dL (ref 8.9–10.3)
CO2: 22 mmol/L (ref 22–32)
Chloride: 110 mmol/L (ref 98–111)
Creatinine, Ser: 0.89 mg/dL (ref 0.61–1.24)
GFR calc Af Amer: >60 mL/min (ref >60)
GFR calc non Af Amer: >60 mL/min (ref >60)
Glucose, Bld: 83 mg/dL (ref 70–99)
Potassium: 3.7 mmol/L (ref 3.5–5.1)
Sodium: 138 mmol/L (ref 135–145)

## 2018-07-03 LAB — NM MYOCAR MULTI W/SPECT W/WALL MOTION / EF
Peak HR: 94 {beats}/min
Rest HR: 56 {beats}/min

## 2018-07-03 LAB — ECHOCARDIOGRAM COMPLETE
HEIGHTINCHES: 70 in
Weight: 3174.62 oz

## 2018-07-03 LAB — TROPONIN I
Troponin I: <0.03 ng/mL (ref <0.03)
Troponin I: <0.03 ng/mL (ref <0.03)

## 2018-07-03 LAB — PROTIME-INR
INR: 1.05
Prothrombin Time: 13.6 seconds (ref 11.4–15.2)

## 2018-07-03 LAB — HIV ANTIBODY (ROUTINE TESTING W REFLEX): HIV Screen 4th Generation wRfx: NONREACTIVE

## 2018-07-03 MED ORDER — REGADENOSON 0.4 MG/5ML IV SOLN
0.4000 mg | Freq: Once | INTRAVENOUS | Status: AC
  Administered 2018-07-03: 0.4 mg via INTRAVENOUS
  Filled 2018-07-03: qty 5

## 2018-07-03 MED ORDER — REGADENOSON 0.4 MG/5ML IV SOLN
INTRAVENOUS | Status: AC
  Administered 2018-07-03: 0.4 mg via INTRAVENOUS
  Filled 2018-07-03: qty 5

## 2018-07-03 MED ORDER — REGADENOSON 0.4 MG/5ML IV SOLN
INTRAVENOUS | Status: AC
  Filled 2018-07-03: qty 5

## 2018-07-03 MED ORDER — TECHNETIUM TC 99M TETROFOSMIN IV KIT
10.0000 | PACK | Freq: Once | INTRAVENOUS | Status: AC | PRN
  Administered 2018-07-03: 10 via INTRAVENOUS

## 2018-07-03 MED ORDER — TECHNETIUM TC 99M TETROFOSMIN IV KIT
30.0000 | PACK | Freq: Once | INTRAVENOUS | Status: AC | PRN
  Administered 2018-07-03: 30 via INTRAVENOUS
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
## 2018-07-03 NOTE — Progress Notes (Signed)
ANTICOAGULATION CONSULT NOTE - Follow Up Consult  Pharmacy Consult for heparin Indication: chest pain/ACS  Labs: Recent Labs    07/02/18 1634 07/02/18 2151 07/03/18 0211  HGB 15.9  --  15.7  HCT 47.9  --  45.7  PLT 214  --  201  HEPARINUNFRC  --   --  0.37  CREATININE 0.92  --   --   TROPONINI  --  <0.03  --     Assessment: 48yo male therapeutic on heparin with initial dosing for atypical CP though at lower end of goal and lab drawn just a few hours after bolus given; no gtt issues or signs of bleeding per RN.  Goal of Therapy:  Heparin level 0.3-0.7 units/ml   Plan:  Will increase heparin gtt by 1 units/kg/hr to 1200 units/hr and check level in 6 hours.    Vernard Gambles, PharmD, BCPS  07/03/2018,2:50 AM

## 2018-07-03 NOTE — Progress Notes (Signed)
    Patient presented for Lexiscan nuclear stress test. Tolerated procedure well. Pending final stress imaging result.  Berton Bon, AGNP-C 07/03/2018  12:08 PM Pager: [phone]

## 2018-07-03 NOTE — Progress Notes (Signed)
  Echocardiogram 2D Echocardiogram has been performed.  Delcie Roch 07/03/2018, 2:26 PM
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
## 2018-07-03 NOTE — Progress Notes (Signed)
ANTICOAGULATION CONSULT NOTE  Pharmacy Consult:  Heparin Indication: chest pain/ACS  No Known Allergies  Patient Measurements: Height: 5\' 10"  (177.8 cm) Weight: 198 lb 6.6 oz (90 kg) IBW/kg (Calculated) : 73 Heparin Dosing Weight: 92kg  Vital Signs: Temp: 98 F (36.7 C) (02/10 1240) Temp Source: Oral (02/10 1240) BP: 136/81 (02/10 1240) Pulse Rate: 66 (02/10 1240)  Labs: Recent Labs    07/02/18 1634 07/02/18 2151 07/03/18 0211 07/03/18 0801 07/03/18 0951 07/03/18 1544  HGB 15.9  --  15.7  --   --   --   HCT 47.9  --  45.7  --   --   --   PLT 214  --  201  --   --   --   LABPROT  --   --  13.6  --   --   --   INR  --   --  1.05  --   --   --   HEPARINUNFRC  --   --  0.37 0.29*  --  0.32  CREATININE 0.92  --  0.89  --   --   --   TROPONINI  --  <0.03 <0.03  --  <0.03  --     Estimated Creatinine Clearance: 115.8 mL/min (by C-G formula based on SCr of 0.89 mg/dL).   Assessment: 86 YOM presenting with CP and Pharmacy consulted to dose heparin.  Heparin level is therapeutic and toward the low end of normal.  No bleeding reported.  Goal of Therapy:  Heparin level 0.3-0.7 units/ml Monitor platelets by anticoagulation protocol: Yes   Plan:  Increase heparin drip to 1500 units/hr F/U AM labs   James Fowler, PharmD, BCPS, BCCCP 07/03/2018, 5:01 PM

## 2018-07-04 LAB — CUP PACEART REMOTE DEVICE CHECK
Date Time Interrogation Session: 20200211054848
Implantable Lead Implant Date: 20160211
Implantable Lead Implant Date: 20160211
Implantable Lead Location: 753860
Implantable Lead Model: 7122
Implantable Pulse Generator Implant Date: 20160211
MDC IDC LEAD LOCATION: 753859
MDC IDC PG SERIAL: 7230141

## 2018-07-05 ENCOUNTER — Telehealth: Payer: Self-pay

## 2018-07-05 NOTE — Telephone Encounter (Signed)
Spoke with pt regarding ATP episodes from 2/10 and 2/11 pt stated that he was just sitting at home when he felt his the device pace him pt reported compliance with Sotalol, and voiced understanding of driving restrictions. Pt aware to keep with GT on 07/18/2018.

## 2018-07-06 ENCOUNTER — Telehealth: Payer: Self-pay | Admitting: *Deleted

## 2018-07-06 NOTE — Telephone Encounter (Signed)
Spoke with patient regarding 5 VT-1 zone VT episodes, each terminated with ATP x1. Patient reports he felt his HR increase with each episode. No dizziness or syncope as he was sitting down at the time. He reports compliance with sotalol 120mg  BID. Reminded patient of driving restrictions x6 months. Advised patient to seek emergency medical attention for any shocks or syncopal episodes prior to his upcoming appointment with Dr. Ladona Ridgel. Will try to move patient's appointment up. Patient verbalizes understanding of all instructions and thanked me for my call.  Discussed with Dr. Gust Rung to add patient on to schedule next week. Melissa, scheduler, aware and will contact patient with sooner appointment.

## 2018-07-07 ENCOUNTER — Telehealth: Payer: Self-pay

## 2018-07-07 MED ORDER — METOPROLOL SUCCINATE ER 25 MG PO TB24: 25.0000 mg | ORAL_TABLET | Freq: Every day | ORAL | 3 refills | Status: DC

## 2018-07-07 NOTE — Telephone Encounter (Signed)
Spoke with pt informed him of Dr. Lubertha Basque recommendation of starting Toprol 25mg  daily in addition to his Sotalol, pt voiced understanding verified the pharmacy that pt would like Rx sent into.

## 2018-07-07 NOTE — Telephone Encounter (Signed)
LV for pt to call device clinic back regarding ATP episode and Dr. Ladona Ridgel recommendation of starting Toprol 25mg  daily

## 2018-07-12 ENCOUNTER — Ambulatory Visit: Payer: 59 | Admitting: Internal Medicine

## 2018-07-12 ENCOUNTER — Encounter: Payer: Self-pay | Admitting: Internal Medicine

## 2018-07-12 VITALS — BP 126/76 | HR 56 | Ht 70.0 in | Wt 205.0 lb

## 2018-07-12 DIAGNOSIS — I472 Ventricular tachycardia, unspecified: Secondary | ICD-10-CM

## 2018-07-12 DIAGNOSIS — Z95810 Presence of automatic (implantable) cardiac defibrillator: Secondary | ICD-10-CM | POA: Diagnosis not present

## 2018-07-12 DIAGNOSIS — I428 Other cardiomyopathies: Secondary | ICD-10-CM | POA: Diagnosis not present

## 2018-07-12 MED ORDER — SOTALOL HCL (AF) 120 MG PO TABS: 180.0000 mg | ORAL_TABLET | Freq: Two times a day (BID) | ORAL | 11 refills | Status: DC

## 2018-07-12 NOTE — Patient Instructions (Addendum)
Medication Instructions:  Your physician has recommended you make the following change in your medication:   1.  Increase your Sotalol 120 mg---Take 1.5 tablets by mouth twice a day  2.  STOP taking METOPROLOL   Labwork: None ordered.  Testing/Procedures: You will come back to the [facility] office in one week for a NURSE VISIT for a 12 lead EKG after increasing your Sotalol.  Follow-Up: Your physician wants you to follow-up in: 3 months with Dr. Ladona Ridgel.      Remote monitoring is used to monitor your ICD from home. This monitoring reduces the number of office visits required to check your device to one time per year. It allows [place] to keep an eye on the functioning of your device to ensure it is working properly. You are scheduled for a device check from home on 10/02/2018. You may send your transmission at any time that day. If you have a wireless device, the transmission will be sent automatically. After your physician reviews your transmission, you will receive a postcard with your next transmission date.  Any Other Special Instructions Will Be Listed Below (If Applicable).  If you need a refill on your cardiac medications before your next appointment, please call your pharmacy.

## 2018-07-12 NOTE — Progress Notes (Signed)
HPI James Fowler returns today for followup of his VT. He is a middle aged man with a h/o VT and dyslipidemia and ARVD. Over the past few months, he has had gradual increase in the frequency of VT. He is minimally symptomatic. He has not had syncope although he does at times get dizzy. No Known Allergies   Current Outpatient Medications  Medication Sig Dispense Refill  . aspirin EC 81 MG tablet Take 162 mg by mouth daily as needed (pain/headache/discomfort).    Marland Kitchen atorvastatin (LIPITOR) 20 MG tablet Take 20 mg by mouth daily.     . Multiple Vitamin (MULTIVITAMIN WITH MINERALS) TABS tablet Take 1 tablet by mouth daily.    Marland Kitchen SOTALOL AF 120 MG TABS Take 1.5 tablets (180 mg total) by mouth 2 (two) times daily. 90 each 11   No current facility-administered medications for this visit.      Past Medical History:  Diagnosis Date  . Arrhythmogenic right ventricular dysplasia (HCC)    a. diagnosed by MRI 06/2014 following presentation with hemodynamically unstable VT  . Hyperlipidemia   . Hypertension   . 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  . VT (ventricular tachycardia) (HCC)    a. 06/2014 ARVD s/p SJM dc AICD.-Dr. Rosette Reveal follows    ROS:   All systems reviewed and negative except as noted in the HPI.   Past Surgical History:  Procedure Laterality Date  . arm surgery  1991   2 steel plates in left arm  . ELECTROPHYSIOLOGY STUDY N/A 07/03/2014   EPS by Dr Graciela Husbands with inducible VT  . IMPLANTABLE CARDIOVERTER DEFIBRILLATOR IMPLANT N/A 07/04/2014   STJ dual chamber ICD implanted by Dr Ladona Ridgel for secondary prevention  . LEFT HEART CATHETERIZATION WITH CORONARY ANGIOGRAM N/A 07/02/2014   no CAD, normal LV function  . TOTAL HIP ARTHROPLASTY Right 07/08/2015   Procedure: RIGHT TOTAL HIP ARTHROPLASTY ANTERIOR APPROACH;  Surgeon: Durene Romans, MD;  Location: [facility];  Service: Orthopedics;  Laterality: Right;  Marland Kitchen VASECTOMY       Family  History  Problem Relation Age of Onset  . Alcohol abuse Mother   . Mental illness Father        committed suicide     Social History   Socioeconomic History  . Marital status: Married    Spouse name: Not on file  . Number of children: Not on file  . Years of education: Not on file  . Highest education level: Not on file  Occupational History  . Not on file  Social Needs  . Financial resource strain: Not on file  . Food insecurity:    Worry: Not on file    Inability: Not on file  . Transportation needs:    Medical: Not on file    Non-medical: Not on file  Tobacco Use  . Smoking status: Former Smoker    Years: 5.00    Types: Cigarettes    Last attempt to quit: 06/23/2013    Years since quitting: 5.0  . Smokeless tobacco: Never Used  Substance and Sexual Activity  . Alcohol use: No  . Drug use: No  . Sexual activity: Yes  Lifestyle  . Physical activity:    Days per week: Not on file    Minutes per session: Not on file  . Stress: Not on file  Relationships  . Social connections:    Talks on phone: Not on file  Gets together: Not on file    Attends religious service: Not on file    Active member of club or organization: Not on file    Attends meetings of clubs or organizations: Not on file    Relationship status: Not on file  . Intimate partner violence:    Fear of current or ex partner: Not on file    Emotionally abused: Not on file    Physically abused: Not on file    Forced sexual activity: Not on file  Other Topics Concern  . Not on file  Social History Narrative  . Not on file     BP 126/76   Pulse (!) 56   Ht 5\' 10"  (1.778 m)   Wt 205 lb (93 kg)   SpO2 98%   BMI 29.41 kg/m   Physical Exam:  Well appearing NAD HEENT: Unremarkable Neck:  No JVD, no thyromegally Lymphatics:  No adenopathy Back:  No CVA tenderness Lungs:  Clear with no wheezes HEART:  Regular rate rhythm, no murmurs, no rubs, no clicks Abd:  soft, positive bowel sounds, no  organomegally, no rebound, no guarding Ext:  2 plus pulses, no edema, no cyanosis, no clubbing Skin:  No rashes no nodules Neuro:  CN II through XII intact, motor grossly intact   DEVICE  Normal device function.  See PaceArt for details.   Assess/Plan: 1. VT - his VT has worsened. Fortunately he has not been shocked. He denies medical non-compliance. He denies worsening CHF symptoms. His symptoms do not occur with exertion. I have offered 3 options. One, uptitrate his medical therapy with sotalol. Two, stop sotalol and start amiodarone. Three, catheter ablation. We will increase the sotalol to 180 bid. Because he has an ICD, I am not going to ask him to be admitted but rather ask him return in a week for an ECG to look at his QT interval.  2. ICD - his St. Jude single chamber ICD is working normally. We will recheck in several months.  Leonia Reeves.D.

## 2018-07-14 LAB — CUP PACEART INCLINIC DEVICE CHECK
Battery Remaining Longevity: 63 mo
Brady Statistic RA Percent Paced: 0.89 %
Brady Statistic RV Percent Paced: 0.25 %
Date Time Interrogation Session: 20200219220815
HighPow Impedance: 75.375
Implantable Lead Implant Date: 20160211
Implantable Lead Implant Date: 20160211
Implantable Lead Location: 753859
Implantable Lead Location: 753860
Lead Channel Impedance Value: 387.5 Ohm
Lead Channel Impedance Value: 525 Ohm
Lead Channel Pacing Threshold Amplitude: 0.75 V
Lead Channel Pacing Threshold Amplitude: 0.75 V
Lead Channel Pacing Threshold Amplitude: 0.75 V
Lead Channel Pacing Threshold Pulse Width: 0.5 ms
Lead Channel Pacing Threshold Pulse Width: 0.5 ms
Lead Channel Pacing Threshold Pulse Width: 0.5 ms
Lead Channel Pacing Threshold Pulse Width: 0.5 ms
Lead Channel Sensing Intrinsic Amplitude: 11.2 mV
Lead Channel Setting Pacing Amplitude: 2 V
Lead Channel Setting Pacing Amplitude: 2.5 V
Lead Channel Setting Pacing Pulse Width: 0.5 ms
Lead Channel Setting Sensing Sensitivity: 0.5 mV
MDC IDC MSMT LEADCHNL RA SENSING INTR AMPL: 4.5 mV
MDC IDC MSMT LEADCHNL RV PACING THRESHOLD AMPLITUDE: 0.75 V
MDC IDC PG IMPLANT DT: 20160211
Pulse Gen Serial Number: 7230141

## 2018-07-14 NOTE — Progress Notes (Signed)
Remote ICD transmission.   

## 2018-07-18 ENCOUNTER — Encounter: Payer: 59 | Admitting: Internal Medicine

## 2018-07-24 ENCOUNTER — Ambulatory Visit (INDEPENDENT_AMBULATORY_CARE_PROVIDER_SITE_OTHER): Payer: 59

## 2018-07-24 VITALS — HR 63 | Ht 60.0 in | Wt 202.0 lb

## 2018-07-24 DIAGNOSIS — Z79899 Other long term (current) drug therapy: Secondary | ICD-10-CM | POA: Diagnosis not present

## 2018-07-24 NOTE — Progress Notes (Signed)
Reason for visit: EKG for Sotalol increase Name of MD requesting visit: Taylor 1.) H&P: See chart  2.) ROS related to problem: Medication increase checking QT interval  3.) Assessment and plan per MD: Per Dr Eden Emms:  Continue Sotalol 180 mg bid and repeat EKG 2 weeks

## 2018-08-09 ENCOUNTER — Encounter: Payer: 59 | Admitting: Internal Medicine

## 2018-08-09 ENCOUNTER — Telehealth: Payer: Self-pay

## 2018-08-09 NOTE — Telephone Encounter (Signed)
Call placed to Pt.  Advised Pt nurse visit would be cancelled.  Per Dr. Lynn Ito EKG from last nurse visit was ok.  Pt states he is feeling well on increased sotalol.  Advised to keep f/u scheduled for October 26, 2018.  Pt indicates understanding.

## 2018-08-10 ENCOUNTER — Ambulatory Visit: Payer: 59

## 2018-10-02 ENCOUNTER — Other Ambulatory Visit: Payer: Self-pay

## 2018-10-02 ENCOUNTER — Ambulatory Visit (INDEPENDENT_AMBULATORY_CARE_PROVIDER_SITE_OTHER): Payer: 59 | Admitting: *Deleted

## 2018-10-02 ENCOUNTER — Telehealth: Payer: Self-pay

## 2018-10-02 DIAGNOSIS — I428 Other cardiomyopathies: Secondary | ICD-10-CM

## 2018-10-02 DIAGNOSIS — I472 Ventricular tachycardia, unspecified: Secondary | ICD-10-CM

## 2018-10-02 NOTE — Telephone Encounter (Signed)
Left message for patient to remind of missed remote transmission.  

## 2018-10-09 LAB — CUP PACEART REMOTE DEVICE CHECK
Date Time Interrogation Session: 20200518152003
Implantable Lead Implant Date: 20160211
Implantable Lead Implant Date: 20160211
Implantable Lead Location: 753859
Implantable Lead Location: 753860
Implantable Lead Model: 7122
Implantable Pulse Generator Implant Date: 20160211
Pulse Gen Serial Number: 7230141

## 2018-10-13 NOTE — Progress Notes (Signed)
Remote ICD transmission.   

## 2018-10-25 ENCOUNTER — Telehealth: Payer: Self-pay

## 2018-10-25 NOTE — Telephone Encounter (Signed)
Left message regarding appt on 10/26/18. 

## 2018-10-25 NOTE — Telephone Encounter (Signed)
Patient returned call

## 2018-10-26 ENCOUNTER — Other Ambulatory Visit: Payer: Self-pay

## 2018-10-26 ENCOUNTER — Encounter: Payer: Self-pay | Admitting: Internal Medicine

## 2018-10-26 ENCOUNTER — Ambulatory Visit (INDEPENDENT_AMBULATORY_CARE_PROVIDER_SITE_OTHER): Payer: 59 | Admitting: Internal Medicine

## 2018-10-26 VITALS — BP 126/80 | HR 50 | Ht 60.0 in | Wt 196.4 lb

## 2018-10-26 DIAGNOSIS — Z95810 Presence of automatic (implantable) cardiac defibrillator: Secondary | ICD-10-CM | POA: Diagnosis not present

## 2018-10-26 DIAGNOSIS — I472 Ventricular tachycardia, unspecified: Secondary | ICD-10-CM

## 2018-10-26 DIAGNOSIS — I428 Other cardiomyopathies: Secondary | ICD-10-CM | POA: Diagnosis not present

## 2018-10-26 LAB — CUP PACEART INCLINIC DEVICE CHECK
Battery Remaining Longevity: 61 mo
Brady Statistic RA Percent Paced: 2.1 %
Brady Statistic RV Percent Paced: 0.04 %
Date Time Interrogation Session: 20200604163339
HighPow Impedance: 76.5 Ohm
Implantable Lead Implant Date: 20160211
Implantable Lead Implant Date: 20160211
Implantable Lead Location: 753859
Implantable Lead Location: 753860
Implantable Lead Model: 7122
Implantable Pulse Generator Implant Date: 20160211
Lead Channel Impedance Value: 375 Ohm
Lead Channel Impedance Value: 525 Ohm
Lead Channel Pacing Threshold Amplitude: 0.75 V
Lead Channel Pacing Threshold Amplitude: 0.75 V
Lead Channel Pacing Threshold Amplitude: 0.75 V
Lead Channel Pacing Threshold Pulse Width: 0.5 ms
Lead Channel Pacing Threshold Pulse Width: 0.5 ms
Lead Channel Pacing Threshold Pulse Width: 0.5 ms
Lead Channel Sensing Intrinsic Amplitude: 3.6 mV
Lead Channel Setting Pacing Amplitude: 2 V
Lead Channel Setting Pacing Amplitude: 2.5 V
Lead Channel Setting Pacing Pulse Width: 0.5 ms
Lead Channel Setting Sensing Sensitivity: 0.5 mV
Pulse Gen Serial Number: 7230141

## 2018-10-26 NOTE — Progress Notes (Signed)
HPI Mr. Cantera returns today for followup of his ICD and VT. He is a pleasant 48 yo man with ARVD, VT and dyslipidemia. He has done well in the interim on sotalol. He denies chest pain or sob. No syncope. He is working out, swimming and activities are unlimited. No trouble taking his sotalol. No edema. No Known Allergies   Current Outpatient Medications  Medication Sig Dispense Refill  . aspirin EC 81 MG tablet Take 162 mg by mouth daily as needed (pain/headache/discomfort).    Marland Kitchen atorvastatin (LIPITOR) 20 MG tablet Take 20 mg by mouth daily.     . Multiple Vitamin (MULTIVITAMIN WITH MINERALS) TABS tablet Take 1 tablet by mouth daily.    Marland Kitchen SOTALOL AF 120 MG TABS Take 1.5 tablets (180 mg total) by mouth 2 (two) times daily. 90 each 11   No current facility-administered medications for this visit.      Past Medical History:  Diagnosis Date  . Arrhythmogenic right ventricular dysplasia (HCC)    a. diagnosed by MRI 06/2014 following presentation with hemodynamically unstable VT  . Hyperlipidemia   . Hypertension   . 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  . VT (ventricular tachycardia) (HCC)    a. 06/2014 ARVD s/p SJM dc AICD.-Dr. Rosette Reveal follows    ROS:   All systems reviewed and negative except as noted in the HPI.   Past Surgical History:  Procedure Laterality Date  . arm surgery  1991   2 steel plates in left arm  . ELECTROPHYSIOLOGY STUDY N/A 07/03/2014   EPS by Dr Graciela Husbands with inducible VT  . IMPLANTABLE CARDIOVERTER DEFIBRILLATOR IMPLANT N/A 07/04/2014   STJ dual chamber ICD implanted by Dr Ladona Ridgel for secondary prevention  . LEFT HEART CATHETERIZATION WITH CORONARY ANGIOGRAM N/A 07/02/2014   no CAD, normal LV function  . TOTAL HIP ARTHROPLASTY Right 07/08/2015   Procedure: RIGHT TOTAL HIP ARTHROPLASTY ANTERIOR APPROACH;  Surgeon: Durene Romans, MD;  Location: [facility];  Service: Orthopedics;  Laterality: Right;  Marland Kitchen  VASECTOMY       Family History  Problem Relation Age of Onset  . Alcohol abuse Mother   . Mental illness Father        committed suicide     Social History   Socioeconomic History  . Marital status: Married    Spouse name: Not on file  . Number of children: Not on file  . Years of education: Not on file  . Highest education level: Not on file  Occupational History  . Not on file  Social Needs  . Financial resource strain: Not on file  . Food insecurity:    Worry: Not on file    Inability: Not on file  . Transportation needs:    Medical: Not on file    Non-medical: Not on file  Tobacco Use  . Smoking status: Former Smoker    Years: 5.00    Types: Cigarettes    Last attempt to quit: 06/23/2013    Years since quitting: 5.3  . Smokeless tobacco: Never Used  Substance and Sexual Activity  . Alcohol use: No  . Drug use: No  . Sexual activity: Yes  Lifestyle  . Physical activity:    Days per week: Not on file    Minutes per session: Not on file  . Stress: Not on file  Relationships  . Social connections:    Talks on phone: Not on file  Gets together: Not on file    Attends religious service: Not on file    Active member of club or organization: Not on file    Attends meetings of clubs or organizations: Not on file    Relationship status: Not on file  . Intimate partner violence:    Fear of current or ex partner: Not on file    Emotionally abused: Not on file    Physically abused: Not on file    Forced sexual activity: Not on file  Other Topics Concern  . Not on file  Social History Narrative  . Not on file     BP 126/80   Pulse (!) 50   Ht 5' (1.524 m)   Wt 196 lb 6.4 oz (89.1 kg)   SpO2 96%   BMI 38.36 kg/m   Physical Exam:  Well appearing NAD HEENT: Unremarkable Neck:  No JVD, no thyromegally Lymphatics:  No adenopathy Back:  No CVA tenderness Lungs:  Clear with no wheezes HEART:  Regular rate rhythm, no murmurs, no rubs, no clicks Abd:   soft, positive bowel sounds, no organomegally, no rebound, no guarding Ext:  2 plus pulses, no edema, no cyanosis, no clubbing Skin:  No rashes no nodules Neuro:  CN II through XII intact, motor grossly intact  EKG - NSR with QTC of 420  DEVICE  Normal device function.  See PaceArt for details.   Assess/Plan: 1. VT - he has had nice control on sotalol. He will continue his current meds including sotalol 180 bid. 2. ARVD - no other arrhythmias. 3. Dyslipidemia - continue statin. 4. ICD - his St. Jude DDD ICD is working normally. We will recheck in several months.  Leonia Reeves.D.

## 2018-10-26 NOTE — Patient Instructions (Signed)
Medication Instructions:  Your physician recommends that you continue on your current medications as directed. Please refer to the Current Medication list given to you today.  Labwork: None ordered.  Testing/Procedures: None ordered.  Follow-Up: Your physician wants you to follow-up in: 6 months with Dr. Ladona Ridgel.   You will receive a reminder letter in the mail two months in advance. If you don't receive a letter, please call our office to schedule the follow-up appointment.  Remote monitoring is used to monitor your ICD from home. This monitoring reduces the number of office visits required to check your device to one time per year. It allows [place] to keep an eye on the functioning of your device to ensure it is working properly. You are scheduled for a device check from home on 01/01/2019. You may send your transmission at any time that day. If you have a wireless device, the transmission will be sent automatically. After your physician reviews your transmission, you will receive a postcard with your next transmission date.  Any Other Special Instructions Will Be Listed Below (If Applicable).  If you need a refill on your cardiac medications before your next appointment, please call your pharmacy.

## 2019-01-01 ENCOUNTER — Encounter: Payer: Self-pay | Admitting: *Deleted

## 2019-01-09 ENCOUNTER — Encounter: Payer: Self-pay | Admitting: Cardiology

## 2019-01-12 ENCOUNTER — Ambulatory Visit (INDEPENDENT_AMBULATORY_CARE_PROVIDER_SITE_OTHER): Payer: Self-pay | Admitting: *Deleted

## 2019-01-12 DIAGNOSIS — I472 Ventricular tachycardia, unspecified: Secondary | ICD-10-CM

## 2019-01-13 LAB — CUP PACEART REMOTE DEVICE CHECK
Date Time Interrogation Session: 20200824110752
Implantable Lead Implant Date: 20160211
Implantable Lead Implant Date: 20160211
Implantable Lead Location: 753859
Implantable Lead Location: 753860
Implantable Lead Model: 7122
Implantable Pulse Generator Implant Date: 20160211
Pulse Gen Serial Number: 7230141

## 2019-01-15 ENCOUNTER — Telehealth: Payer: Self-pay | Admitting: Emergency Medicine

## 2019-01-15 NOTE — Telephone Encounter (Signed)
Pt reports he was asleep at time of event 12/14/18. No symptoms reported.

## 2019-01-15 NOTE — Telephone Encounter (Signed)
LMOM .  Calling to assess if symptomatic 12/14/18 @ 1007 with VT episode.

## 2019-01-15 NOTE — Telephone Encounter (Signed)
Received alert from [facility] for NSVT that is falling in and out of detection zone. Next f/u is 04/2019.

## 2019-01-17 NOTE — Telephone Encounter (Signed)
No change 

## 2019-01-19 ENCOUNTER — Encounter: Payer: Self-pay | Admitting: Cardiology

## 2019-01-19 NOTE — Progress Notes (Signed)
Remote ICD transmission.   

## 2019-02-21 ENCOUNTER — Telehealth: Payer: Self-pay | Admitting: *Deleted

## 2019-02-21 NOTE — Telephone Encounter (Signed)
Spoke with patient regarding alert received for VT-1 episode on 01/05/19 at 22:52, treated with ATP x1, late break. Pt does not recall any symptoms, reports he is typically asleep by this time. Confirms compliance with cardiac medications, including sotalol. Advised of [place] DMV driving restrictions x6 months. Pt verbalizes understanding. Will send to Dr. Lovena Le for any new recommendations.  Discussed [employer] monitor issues as alert transmission was delayed. Pt reports the monitor has been blinking and beeping every day around 14:00. Advised I will call tech services on Friday to seek recommendations. Pt in agreement with plan, aware to call for any symptomatic episodes in the meantime. No further questions at this time.

## 2019-02-22 NOTE — Telephone Encounter (Signed)
Continue current meds. GT 

## 2019-02-23 NOTE — Telephone Encounter (Signed)
Spoke with [employer]. A representative will contact patient later today to determine if cellular adapter or monitor needs to be replaced.  Spoke with patient. Advised that Dr. Lovena Le recommends no changes at this time. Pt is aware that he will receive a call from a [employer] rep. Agrees to call our office back if he any questions or concerns after monitor troubleshooting. No further questions at this time.

## 2019-03-09 ENCOUNTER — Telehealth: Payer: Self-pay | Admitting: Emergency Medicine

## 2019-03-09 NOTE — Telephone Encounter (Signed)
Dr Lovena Le wants to continue same course of treatment.

## 2019-03-09 NOTE — Telephone Encounter (Signed)
LMOM  To call device clinic to asess for sx r/t VT episode 03/07/19 that occurred at 6:25 pm, fell in VT-1 zone and was successfully treated with 1 round of ATP. On sotolol.

## 2019-03-09 NOTE — Telephone Encounter (Signed)
Patient was aware event occurred on 03/07/19. No syncope. He felt dizzy for a few seconds. No symptoms at this time. Educated to ensure he continues to take sotolol regularly. Shock plan reviewed. Informed DMV regulations require that he suspend driving  For 6 months from Mar 07, 2019 due to event treated by ICD.

## 2019-04-13 ENCOUNTER — Encounter: Payer: Self-pay | Admitting: *Deleted

## 2019-05-04 ENCOUNTER — Other Ambulatory Visit: Payer: Self-pay

## 2019-05-04 ENCOUNTER — Ambulatory Visit: Payer: Self-pay | Admitting: Internal Medicine

## 2019-05-04 ENCOUNTER — Encounter: Payer: Self-pay | Admitting: Internal Medicine

## 2019-05-04 VITALS — BP 138/84 | HR 63 | Ht 60.0 in | Wt 201.2 lb

## 2019-05-04 DIAGNOSIS — I472 Ventricular tachycardia, unspecified: Secondary | ICD-10-CM

## 2019-05-04 DIAGNOSIS — I429 Cardiomyopathy, unspecified: Secondary | ICD-10-CM

## 2019-05-04 DIAGNOSIS — Z95810 Presence of automatic (implantable) cardiac defibrillator: Secondary | ICD-10-CM

## 2019-05-04 LAB — CUP PACEART INCLINIC DEVICE CHECK
Battery Remaining Longevity: 56 mo
Brady Statistic RA Percent Paced: 2.4 %
Brady Statistic RV Percent Paced: 0.01 %
Date Time Interrogation Session: 20201211155900
HighPow Impedance: 73.125
Implantable Lead Implant Date: 20160211
Implantable Lead Implant Date: 20160211
Implantable Lead Location: 753859
Implantable Lead Location: 753860
Implantable Lead Model: 7122
Implantable Pulse Generator Implant Date: 20160211
Lead Channel Impedance Value: 375 Ohm
Lead Channel Impedance Value: 512.5 Ohm
Lead Channel Pacing Threshold Amplitude: 0.5 V
Lead Channel Pacing Threshold Amplitude: 0.5 V
Lead Channel Pacing Threshold Pulse Width: 0.5 ms
Lead Channel Pacing Threshold Pulse Width: 0.5 ms
Lead Channel Sensing Intrinsic Amplitude: 10.5 mV
Lead Channel Sensing Intrinsic Amplitude: 4.1 mV
Lead Channel Setting Pacing Amplitude: 2 V
Lead Channel Setting Pacing Amplitude: 2.5 V
Lead Channel Setting Pacing Pulse Width: 0.5 ms
Lead Channel Setting Sensing Sensitivity: 0.5 mV
Pulse Gen Serial Number: 7230141

## 2019-05-04 NOTE — Progress Notes (Signed)
HPI James Fowler returns today for followup of his ICD and VT. He is a pleasant 48 yo man with ARVD, VT and dyslipidemia. He has done well in the interim on sotalol. He denies chest pain or sob. No syncope. He is working out, swimming and activities are unlimited. No trouble taking his sotalol. No edema. He has had a couple of episodes of VT. Despite this he feels well.  No Known Allergies   Current Outpatient Medications  Medication Sig Dispense Refill  . aspirin EC 81 MG tablet Take 162 mg by mouth daily as needed (pain/headache/discomfort).    Marland Kitchen atorvastatin (LIPITOR) 20 MG tablet Take 20 mg by mouth daily.     . Multiple Vitamin (MULTIVITAMIN WITH MINERALS) TABS tablet Take 1 tablet by mouth daily.    Marland Kitchen SOTALOL AF 120 MG TABS Take 1.5 tablets (180 mg total) by mouth 2 (two) times daily. 90 each 11   No current facility-administered medications for this visit.     Past Medical History:  Diagnosis Date  . Arrhythmogenic right ventricular dysplasia (HCC)    a. diagnosed by MRI 06/2014 following presentation with hemodynamically unstable VT  . Hyperlipidemia   . Hypertension   . 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  . VT (ventricular tachycardia) (HCC)    a. 06/2014 ARVD s/p SJM dc AICD.-Dr. Rosette Reveal follows    ROS:   All systems reviewed and negative except as noted in the HPI.   Past Surgical History:  Procedure Laterality Date  . arm surgery  1991   2 steel plates in left arm  . ELECTROPHYSIOLOGY STUDY N/A 07/03/2014   EPS by Dr Graciela Husbands with inducible VT  . IMPLANTABLE CARDIOVERTER DEFIBRILLATOR IMPLANT N/A 07/04/2014   STJ dual chamber ICD implanted by Dr Ladona Ridgel for secondary prevention  . LEFT HEART CATHETERIZATION WITH CORONARY ANGIOGRAM N/A 07/02/2014   no CAD, normal LV function  . TOTAL HIP ARTHROPLASTY Right 07/08/2015   Procedure: RIGHT TOTAL HIP ARTHROPLASTY ANTERIOR APPROACH;  Surgeon: Durene Romans, MD;   Location: [facility];  Service: Orthopedics;  Laterality: Right;  Marland Kitchen VASECTOMY       Family History  Problem Relation Age of Onset  . Alcohol abuse Mother   . Mental illness Father        committed suicide     Social History   Socioeconomic History  . Marital status: Married    Spouse name: Not on file  . Number of children: Not on file  . Years of education: Not on file  . Highest education level: Not on file  Occupational History  . Not on file  Tobacco Use  . Smoking status: Former Smoker    Years: 5.00    Types: Cigarettes    Quit date: 06/23/2013    Years since quitting: 5.8  . Smokeless tobacco: Never Used  Substance and Sexual Activity  . Alcohol use: No  . Drug use: No  . Sexual activity: Yes  Other Topics Concern  . Not on file  Social History Narrative  . Not on file   Social Determinants of Health   Financial Resource Strain:   . Difficulty of Paying Living Expenses: Not on file  Food Insecurity:   . Worried About Programme researcher, broadcasting/film/video in the Last Year: Not on file  . Ran Out of Food in the Last Year: Not on file  Transportation Needs:   . Lack of Transportation (  Medical): Not on file  . Lack of Transportation (Non-Medical): Not on file  Physical Activity:   . Days of Exercise per Week: Not on file  . Minutes of Exercise per Session: Not on file  Stress:   . Feeling of Stress : Not on file  Social Connections:   . Frequency of Communication with Friends and Family: Not on file  . Frequency of Social Gatherings with Friends and Family: Not on file  . Attends Religious Services: Not on file  . Active Member of Clubs or Organizations: Not on file  . Attends Archivist Meetings: Not on file  . Marital Status: Not on file  Intimate Partner Violence:   . Fear of Current or Ex-Partner: Not on file  . Emotionally Abused: Not on file  . Physically Abused: Not on file  . Sexually Abused: Not on file     BP 138/84   Pulse 63   Ht 5' (1.524 m)    Wt 201 lb 3.2 oz (91.3 kg)   SpO2 98%   BMI 39.29 kg/m   Physical Exam:  Well appearing NAD HEENT: Unremarkable Neck:  No JVD, no thyromegally Lymphatics:  No adenopathy Back:  No CVA tenderness Lungs:  Clear with no wheezes HEART:  Regular rate rhythm, no murmurs, no rubs, no clicks Abd:  soft, positive bowel sounds, no organomegally, no rebound, no guarding Ext:  2 plus pulses, no edema, no cyanosis, no clubbing Skin:  No rashes no nodules Neuro:  CN II through XII intact, motor grossly intact  EKG - nsr   DEVICE  Normal device function.  See PaceArt for details.   Assess/Plan: 1. VT - we had tried to uptitrate his sotalol from 120 to 180 twice daily but he developed severe muscle aches and had to go back to 120 bid. We discuss the addition of mexitil and amio as well as catheter ablation. He will continue as he is "I feel good." and I will see him back in a year. If his VT comes back in the next couple of months, I would ask him to add mexitil. 2. ARVD - he will continue his current meds. He has no evidence of right or left sided CHF. 3. HTN - his bp is minimally elevated.   Mikle Bosworth.D.

## 2019-05-04 NOTE — Patient Instructions (Signed)
Medication Instructions:  Your physician recommends that you continue on your current medications as directed. Please refer to the Current Medication list given to you today.  Labwork: None ordered.  Testing/Procedures: None ordered.  Follow-Up: Your physician wants you to follow-up in: one year with Dr. Lovena Le.   You will receive a reminder letter in the mail two months in advance. If you don't receive a letter, please call our office to schedule the follow-up appointment.  Remote monitoring is used to monitor your ICD from home. This monitoring reduces the number of office visits required to check your device to one time per year. It allows [place] to keep an eye on the functioning of your device to ensure it is working properly. You are scheduled for a device check from home on 07/13/2019. You may send your transmission at any time that day. If you have a wireless device, the transmission will be sent automatically. After your physician reviews your transmission, you will receive a postcard with your next transmission date.  Any Other Special Instructions Will Be Listed Below (If Applicable).  If you need a refill on your cardiac medications before your next appointment, please call your pharmacy.

## 2019-07-02 ENCOUNTER — Telehealth: Payer: Self-pay | Admitting: Emergency Medicine

## 2019-07-02 NOTE — Telephone Encounter (Signed)
Continue current meds 

## 2019-07-02 NOTE — Telephone Encounter (Signed)
  Assessed for symptoms due to episode of VT on 06/29/19 @ 1855  that was successfully terminated with ATP x 1. Reports he was in the shower and felt dizzy at time of event. No missed doses of sotolol. Asymptomatic since event occurred. Will route to Dr Ladona Ridgel for review.  Patient has paperwork with a "cardiac questionare" from an attorney that he needs completed by Dr Ladona Ridgel. He reports he left message with medical records last week but has not received call back. Will route to medical records to contact patient.

## 2019-07-03 ENCOUNTER — Telehealth: Payer: Self-pay

## 2019-07-03 NOTE — Telephone Encounter (Signed)
The pt states his attorney wants a letter about the episode he had and if his monitor did indeed treat his VT episode. I told him the nurse will call him tomorrow about the letter. The pt verbalized understanding.

## 2019-07-03 NOTE — Telephone Encounter (Signed)
Spoke with patient, advised to continue current medications, no changes per Dr. Ladona Ridgel. Patient verbalizes understanding and denies any questions or new concerns at this time. Patient aware of [place] DMV driving restrictions x6 months.

## 2019-07-06 NOTE — Telephone Encounter (Signed)
Cardiac questionaire completed and faxed to [facility] lawyer, as requested by Pt.  Also completed letter describing recent ATP, see letters.  No further action needed.

## 2019-07-13 ENCOUNTER — Ambulatory Visit (INDEPENDENT_AMBULATORY_CARE_PROVIDER_SITE_OTHER): Payer: BC Managed Care – PPO | Admitting: *Deleted

## 2019-07-13 DIAGNOSIS — I472 Ventricular tachycardia, unspecified: Secondary | ICD-10-CM

## 2019-07-13 LAB — CUP PACEART REMOTE DEVICE CHECK
Battery Remaining Longevity: 51 mo
Battery Remaining Percentage: 50 %
Battery Voltage: 2.92 V
Brady Statistic AP VP Percent: 1 %
Brady Statistic AP VS Percent: 1.5 %
Brady Statistic AS VP Percent: 1 %
Brady Statistic AS VS Percent: 98 %
Brady Statistic RA Percent Paced: 1.3 %
Brady Statistic RV Percent Paced: 1 %
Date Time Interrogation Session: 20210219020018
HighPow Impedance: 71 Ohm
HighPow Impedance: 71 Ohm
Implantable Lead Implant Date: 20160211
Implantable Lead Implant Date: 20160211
Implantable Lead Location: 753859
Implantable Lead Location: 753860
Implantable Lead Model: 7122
Implantable Pulse Generator Implant Date: 20160211
Lead Channel Impedance Value: 360 Ohm
Lead Channel Impedance Value: 460 Ohm
Lead Channel Pacing Threshold Amplitude: 0.5 V
Lead Channel Pacing Threshold Amplitude: 0.5 V
Lead Channel Pacing Threshold Pulse Width: 0.5 ms
Lead Channel Pacing Threshold Pulse Width: 0.5 ms
Lead Channel Sensing Intrinsic Amplitude: 3 mV
Lead Channel Sensing Intrinsic Amplitude: 9.2 mV
Lead Channel Setting Pacing Amplitude: 2 V
Lead Channel Setting Pacing Amplitude: 2.5 V
Lead Channel Setting Pacing Pulse Width: 0.5 ms
Lead Channel Setting Sensing Sensitivity: 0.5 mV
Pulse Gen Serial Number: 7230141

## 2019-07-13 NOTE — Progress Notes (Signed)
ICD Remote  

## 2019-08-12 ENCOUNTER — Other Ambulatory Visit: Payer: Self-pay | Admitting: Internal Medicine

## 2019-08-16 ENCOUNTER — Telehealth: Payer: Self-pay | Admitting: *Deleted

## 2019-08-16 NOTE — Telephone Encounter (Signed)
Merlin alert received for VT-1 episode on 08/15/19 at 12:27pm. Spoke with patient. He reports he was sitting on his couch, eating lunch at the time. Reports he felt episode, brief lightheadedness and SOB. Reports compliance with cardiac medications, including sotalol 180mg  BID. Pt aware of [place] DMV driving restrictions x6 months.  Patient reports that he has noticed that he's "slower" with everyday activities over the past few months. More fatigued. Reports he is unsure if this is due to cardiac issues, but wanted to make Dr. aware. He has f/u with his PCP in the next couple of weeks. Next f/u with Dr. Ladona Ridgel is due in 04/2020. Advised pt that message will be forwarded to Dr. 05/2020 for review and recommendations. Pt in agreement with plan.

## 2019-08-20 MED ORDER — MEXILETINE HCL 150 MG PO CAPS: 150.0000 mg | ORAL_CAPSULE | Freq: Two times a day (BID) | ORAL | 11 refills | Status: DC

## 2019-08-20 NOTE — Telephone Encounter (Signed)
Per Dr. Ladona Ridgel-  Have Pt start mexiletine 150 mg PO BID.  Pt will only need sooner f/u if he would like to discuss ablation.

## 2019-08-20 NOTE — Telephone Encounter (Signed)
Left detailed message per DPR.  Advised would send in 30 days worth of mexitil for Pt to try.  Mexitil 150 mg PO BID  Will follow up in one month to see how Pt is tolerating

## 2019-10-12 ENCOUNTER — Ambulatory Visit: Payer: Self-pay

## 2019-10-19 LAB — CUP PACEART REMOTE DEVICE CHECK
Battery Remaining Longevity: 49 mo
Battery Remaining Percentage: 48 %
Battery Voltage: 2.92 V
Brady Statistic AP VP Percent: 1 %
Brady Statistic AP VS Percent: 1.3 %
Brady Statistic AS VP Percent: 1 %
Brady Statistic AS VS Percent: 98 %
Brady Statistic RA Percent Paced: 1.2 %
Brady Statistic RV Percent Paced: 1 %
Date Time Interrogation Session: 20210521030557
HighPow Impedance: 72 Ohm
HighPow Impedance: 72 Ohm
Implantable Lead Implant Date: 20160211
Implantable Lead Implant Date: 20160211
Implantable Lead Location: 753859
Implantable Lead Location: 753860
Implantable Lead Model: 7122
Implantable Pulse Generator Implant Date: 20160211
Lead Channel Impedance Value: 360 Ohm
Lead Channel Impedance Value: 460 Ohm
Lead Channel Pacing Threshold Amplitude: 0.5 V
Lead Channel Pacing Threshold Amplitude: 0.5 V
Lead Channel Pacing Threshold Pulse Width: 0.5 ms
Lead Channel Pacing Threshold Pulse Width: 0.5 ms
Lead Channel Sensing Intrinsic Amplitude: 10.2 mV
Lead Channel Sensing Intrinsic Amplitude: 3.2 mV
Lead Channel Setting Pacing Amplitude: 2 V
Lead Channel Setting Pacing Amplitude: 2.5 V
Lead Channel Setting Pacing Pulse Width: 0.5 ms
Lead Channel Setting Sensing Sensitivity: 0.5 mV
Pulse Gen Serial Number: 7230141

## 2019-11-19 ENCOUNTER — Telehealth: Payer: Self-pay

## 2019-11-19 NOTE — Telephone Encounter (Signed)
Merlin alert received today for event occurring on 10/26/19- VT-1 episode successfully treated with ATP.    Current meds include Mexiletine 150mg  BID and Sotalol 180mg  BID.  Mexiletine was started in March following previous treated VT episode.    Spoke with pt, the reason for the dealy in sending report is that he was on vacation.  He returned yesterday.  He reports at time of episode he was playing on the beach, he did feel lightheaded.  He realized he missed his medications that day so he took them at that time.  He does report one other day during his vacation that he m,missed medications but did not have a reoccurrence of symptoms.    Educated pt on importance of medication compliance and DMV driving restrictions.  Pt v/u that he cannot operate a motor vehicle for 6 months following this treated episode.  Advised would forward info to MD, anticipate continued monitoring.

## 2019-11-20 NOTE — Telephone Encounter (Signed)
Continue current meds. Try not to miss any. GT

## 2020-01-08 ENCOUNTER — Other Ambulatory Visit: Payer: Self-pay | Admitting: Internal Medicine

## 2020-01-11 ENCOUNTER — Ambulatory Visit: Payer: Self-pay

## 2020-04-20 LAB — CUP PACEART REMOTE DEVICE CHECK
Battery Remaining Longevity: 46 mo
Battery Remaining Percentage: 46 %
Battery Voltage: 2.9 V
Brady Statistic AP VP Percent: 1 %
Brady Statistic AP VS Percent: 3.5 %
Brady Statistic AS VP Percent: 1 %
Brady Statistic AS VS Percent: 96 %
Brady Statistic RA Percent Paced: 3.1 %
Brady Statistic RV Percent Paced: 1 %
Date Time Interrogation Session: 20210820020025
HighPow Impedance: 79 Ohm
HighPow Impedance: 79 Ohm
Implantable Lead Implant Date: 20160211
Implantable Lead Implant Date: 20160211
Implantable Lead Location: 753859
Implantable Lead Location: 753860
Implantable Lead Model: 7122
Implantable Pulse Generator Implant Date: 20160211
Lead Channel Impedance Value: 360 Ohm
Lead Channel Impedance Value: 460 Ohm
Lead Channel Pacing Threshold Amplitude: 0.5 V
Lead Channel Pacing Threshold Amplitude: 0.5 V
Lead Channel Pacing Threshold Pulse Width: 0.5 ms
Lead Channel Pacing Threshold Pulse Width: 0.5 ms
Lead Channel Sensing Intrinsic Amplitude: 3.3 mV
Lead Channel Sensing Intrinsic Amplitude: 9.7 mV
Lead Channel Setting Pacing Amplitude: 2 V
Lead Channel Setting Pacing Amplitude: 2.5 V
Lead Channel Setting Pacing Pulse Width: 0.5 ms
Lead Channel Setting Sensing Sensitivity: 0.5 mV
Pulse Gen Serial Number: 7230141

## 2020-04-24 ENCOUNTER — Ambulatory Visit (INDEPENDENT_AMBULATORY_CARE_PROVIDER_SITE_OTHER): Payer: BC Managed Care – PPO

## 2020-04-24 DIAGNOSIS — I472 Ventricular tachycardia, unspecified: Secondary | ICD-10-CM

## 2020-04-25 LAB — CUP PACEART REMOTE DEVICE CHECK
Battery Remaining Longevity: 45 mo
Battery Remaining Percentage: 44 %
Battery Voltage: 2.9 V
Brady Statistic AP VP Percent: 1 %
Brady Statistic AP VS Percent: 3.8 %
Brady Statistic AS VP Percent: 1 %
Brady Statistic AS VS Percent: 96 %
Brady Statistic RA Percent Paced: 3.4 %
Brady Statistic RV Percent Paced: 1 %
Date Time Interrogation Session: 20211201121440
HighPow Impedance: 81 Ohm
HighPow Impedance: 81 Ohm
Implantable Lead Implant Date: 20160211
Implantable Lead Implant Date: 20160211
Implantable Lead Location: 753859
Implantable Lead Location: 753860
Implantable Lead Model: 7122
Implantable Pulse Generator Implant Date: 20160211
Lead Channel Impedance Value: 380 Ohm
Lead Channel Impedance Value: 510 Ohm
Lead Channel Pacing Threshold Amplitude: 0.5 V
Lead Channel Pacing Threshold Amplitude: 0.5 V
Lead Channel Pacing Threshold Pulse Width: 0.5 ms
Lead Channel Pacing Threshold Pulse Width: 0.5 ms
Lead Channel Sensing Intrinsic Amplitude: 10.2 mV
Lead Channel Sensing Intrinsic Amplitude: 3.7 mV
Lead Channel Setting Pacing Amplitude: 2 V
Lead Channel Setting Pacing Amplitude: 2.5 V
Lead Channel Setting Pacing Pulse Width: 0.5 ms
Lead Channel Setting Sensing Sensitivity: 0.5 mV
Pulse Gen Serial Number: 7230141

## 2020-05-06 ENCOUNTER — Other Ambulatory Visit: Payer: Self-pay | Admitting: Internal Medicine

## 2020-05-06 NOTE — Progress Notes (Signed)
Remote ICD transmission.   

## 2020-06-15 IMAGING — CT CT ANGIO CHEST
2 of 9 series · 18 of 46 positions shown · IV contrast (iopamidol)
Comparison: Chest radiograph 07/02/2018

CLINICAL DATA: Constant left-sided chest pain and shortness of
breath.

EXAM:
CT ANGIOGRAPHY CHEST WITH CONTRAST
TECHNIQUE: Multidetector CT imaging of the chest was performed using the
standard protocol during bolus administration of intravenous
contrast. Multiplanar CT image reconstructions and MIPs were
obtained to evaluate the vascular anatomy.
CONTRAST:  75mL [id number] IOPAMIDOL ([id number]) INJECTION 76%

[Series 6: thins · axial · 0.82mm/px · z∈[+1273,+1516]mm · 15 of 277 slices shown]
[im 17/277  lung]
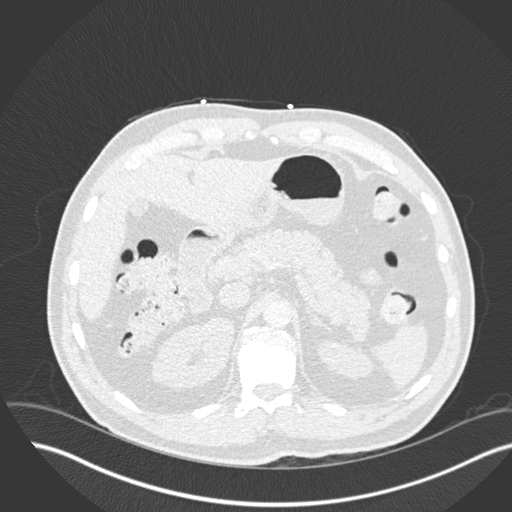
[im 33/277  soft-tissue]
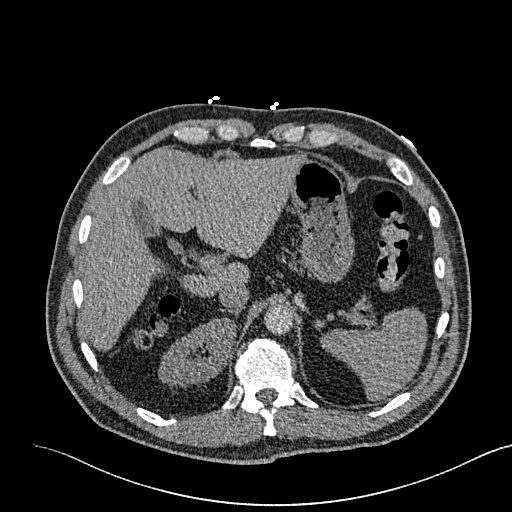
[im 49/277  lung]
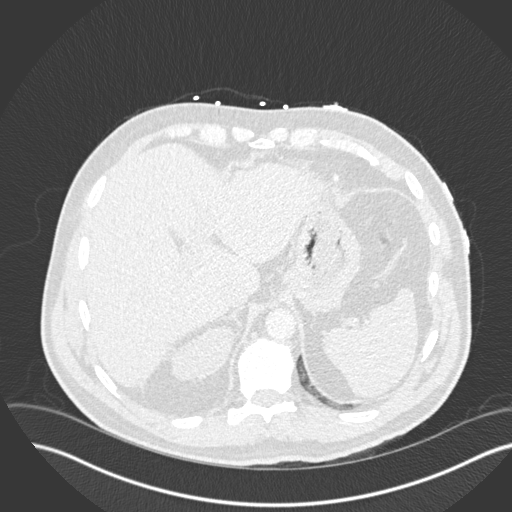
[im 65/277  soft-tissue]
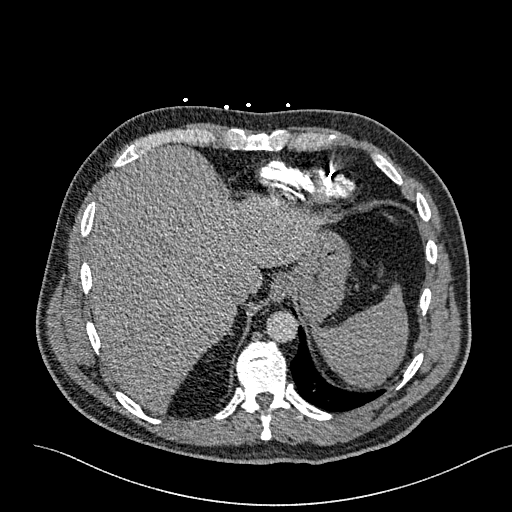
[im 82/277  lung]
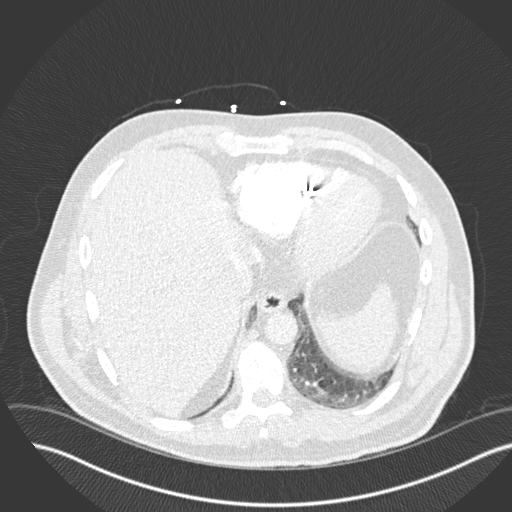
[im 98/277  soft-tissue]
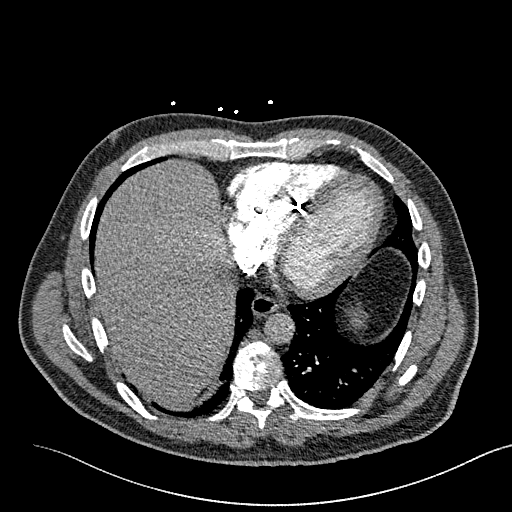
[im 114/277  lung]
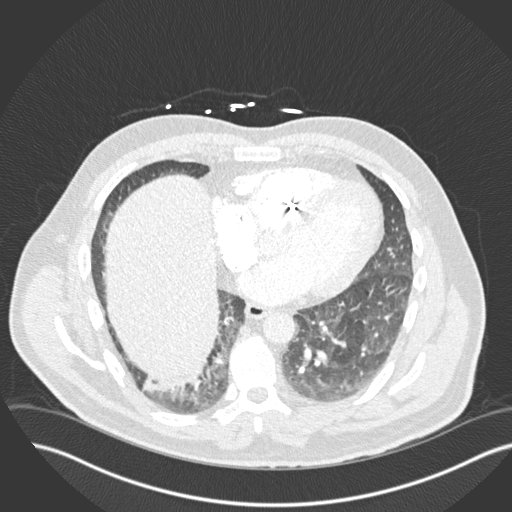
[im 147/277  soft-tissue]
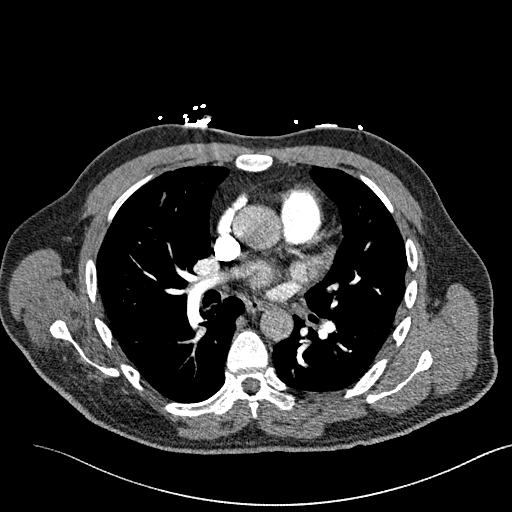
[im 163/277  lung]
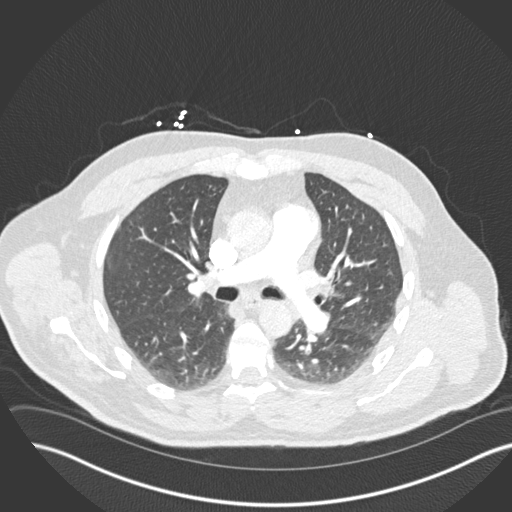
[im 179/277  soft-tissue]
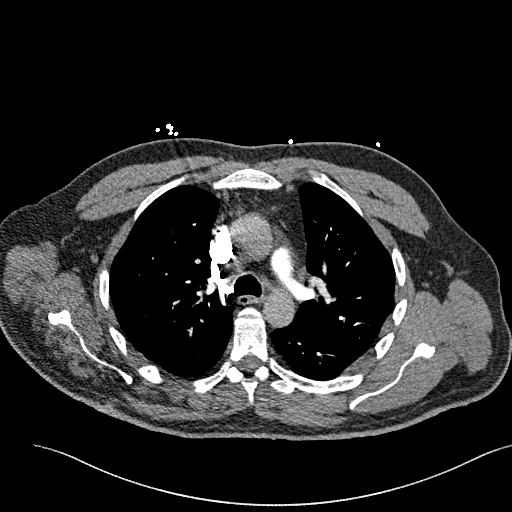
[im 195/277  lung]
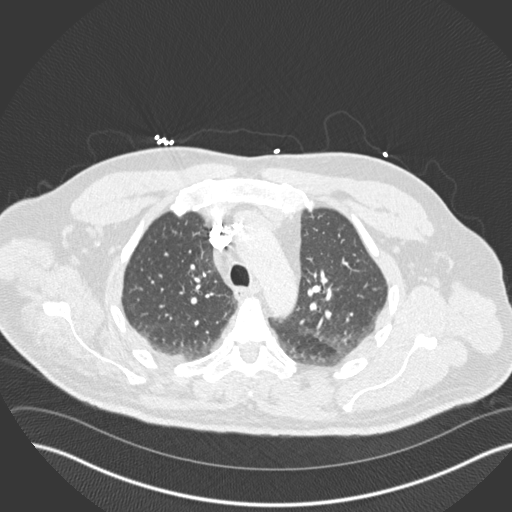
[im 212/277  soft-tissue]
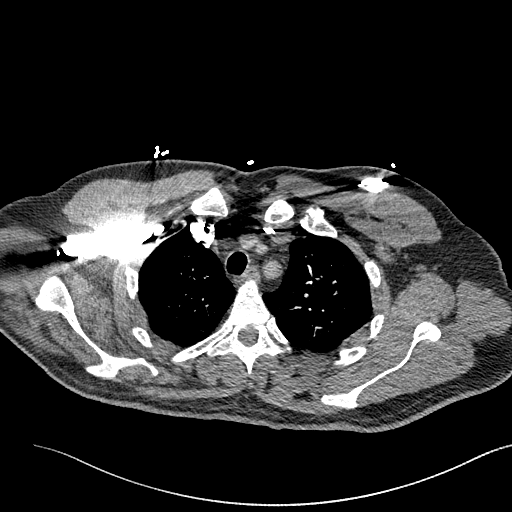
[im 228/277  lung]
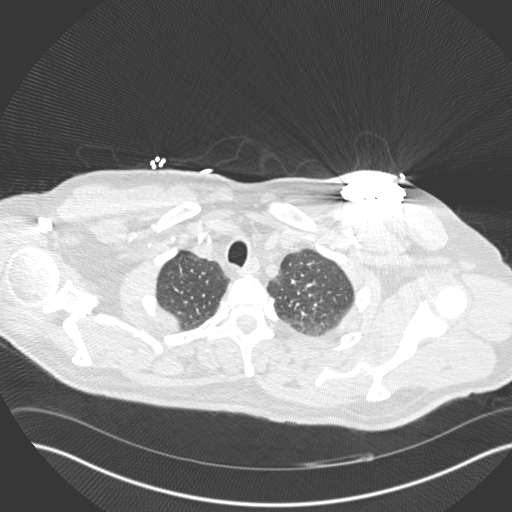
[im 244/277  soft-tissue]
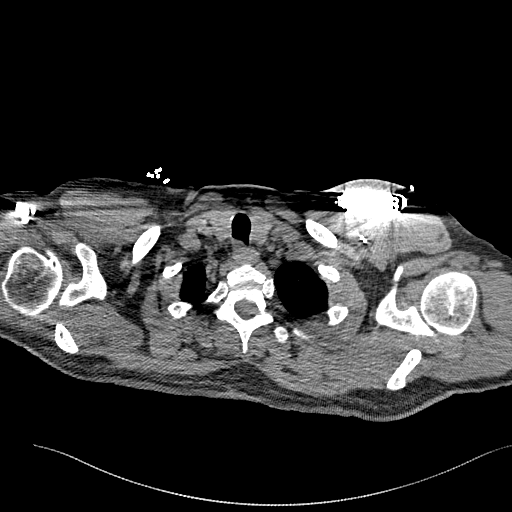
[im 260/277  lung]
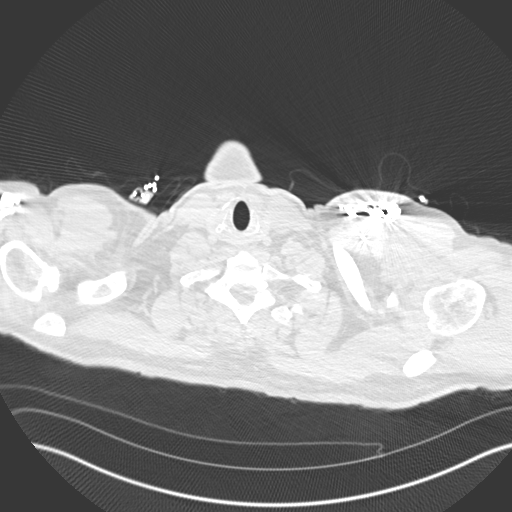

[Series 8: coronal mpr · coronal · 0.59mm/px · 3 of 151 slices shown]
[im 38/151  soft-tissue]
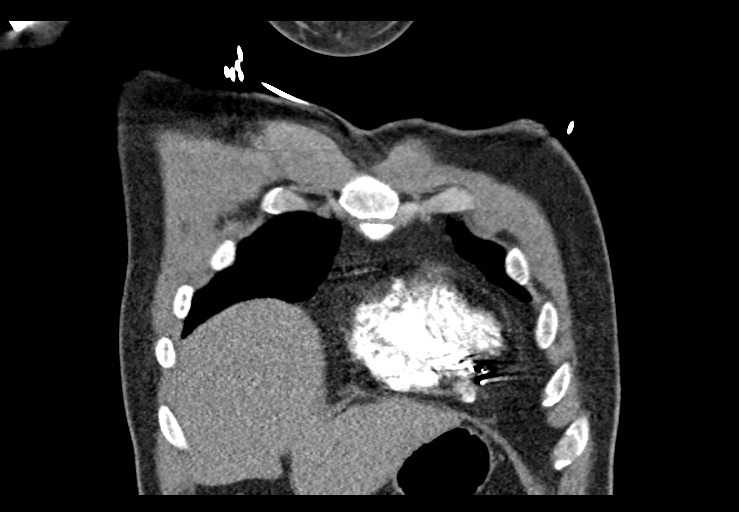
[im 76/151  soft-tissue]
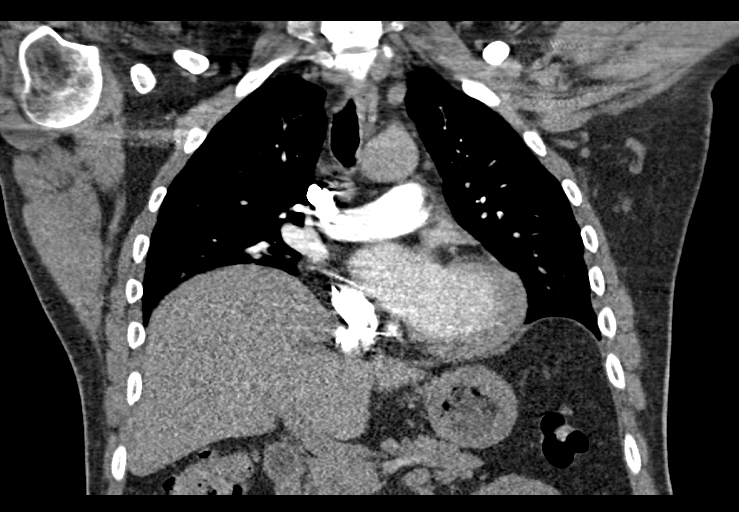
[im 113/151  soft-tissue]
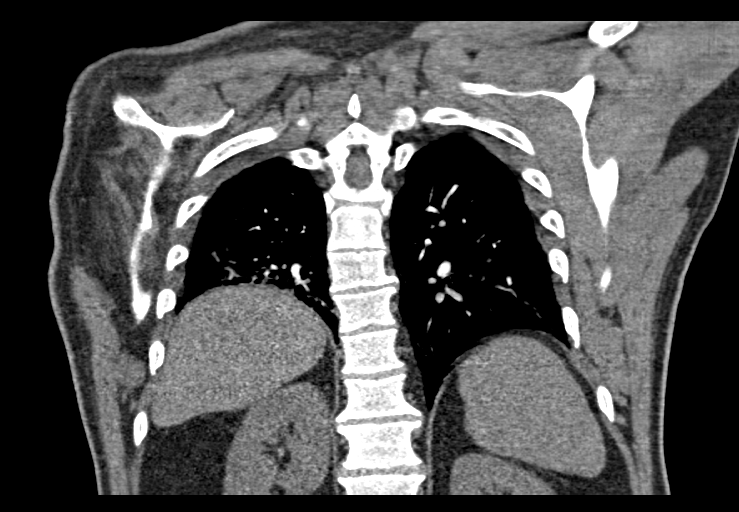

[18 of 46 positions shown; findings below may reference images not displayed]

FINDINGS: Cardiovascular: Normal heart size. Aorta and main pulmonary artery
normal in caliber. Adequate opacification of the pulmonary arterial
system. Motion artifact limits evaluation. No filling defect
identified to suggest acute pulmonary embolus.

Mediastinum/Nodes: Prominent subcentimeter mediastinal lymph nodes.
Normal appearance of the esophagus. No axillary adenopathy.

Lungs/Pleura: Central airways are patent. Dependent atelectasis
within the right middle and right lower lobes. No large area
pulmonary consolidation. No pleural effusion or pneumothorax.

Upper Abdomen: No acute process.

Musculoskeletal: Thoracic spine degenerative changes. No aggressive
or acute appearing osseous lesions.

Review of the MIP images confirms the above findings.
IMPRESSION: No acute pulmonary embolus.

No acute process within the chest.

## 2020-06-30 ENCOUNTER — Ambulatory Visit: Payer: BC Managed Care – PPO | Admitting: Internal Medicine

## 2020-06-30 ENCOUNTER — Other Ambulatory Visit: Payer: Self-pay

## 2020-06-30 ENCOUNTER — Encounter: Payer: Self-pay | Admitting: Internal Medicine

## 2020-06-30 DIAGNOSIS — I472 Ventricular tachycardia, unspecified: Secondary | ICD-10-CM

## 2020-06-30 DIAGNOSIS — I429 Cardiomyopathy, unspecified: Secondary | ICD-10-CM

## 2020-06-30 MED ORDER — SOTALOL HCL (AF) 120 MG PO TABS: 120.0000 mg | ORAL_TABLET | Freq: Two times a day (BID) | ORAL | 2 refills | Status: DC

## 2020-06-30 NOTE — Progress Notes (Signed)
HPI [name] returns today for followup of his ICD and VT. He is a pleasant 50 yo man with ARVD, VT and dyslipidemia. He has done well in the interim on sotalol. He denies chest pain or sob. No syncope. He is working out, swimming and activities are unlimited. No trouble taking his sotalol. No edema. He has had a couple of episodes of VT. Despite this he feels well.  No Known Allergies   Current Outpatient Medications  Medication Sig Dispense Refill  . aspirin EC 81 MG tablet Take 162 mg by mouth daily as needed (pain/headache/discomfort).    Marland Kitchen atorvastatin (LIPITOR) 20 MG tablet Take 20 mg by mouth daily.     Marland Kitchen SOTALOL AF 120 MG TABS Take 1.5 tablets (180 mg total) by mouth 2 (two) times daily. Please keep upcoming appt in February 2022 with Dr. Ladona Fowler for future refills. Thank you 90 tablet 2   No current facility-administered medications for this visit.     Past Medical History:  Diagnosis Date  . Arrhythmogenic right ventricular dysplasia (HCC)    a. diagnosed by MRI 06/2014 following presentation with hemodynamically unstable VT  . Hyperlipidemia   . Hypertension   . 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  . VT (ventricular tachycardia) (HCC)    a. 06/2014 ARVD s/p SJM dc AICD.-Dr. Rosette Fowler follows    ROS:   All systems reviewed and negative except as noted in the HPI.   Past Surgical History:  Procedure Laterality Date  . arm surgery  1991   2 steel plates in left arm  . ELECTROPHYSIOLOGY STUDY N/A 07/03/2014   EPS by Dr James Fowler with inducible VT  . IMPLANTABLE CARDIOVERTER DEFIBRILLATOR IMPLANT N/A 07/04/2014   STJ dual chamber ICD implanted by Dr James Fowler for secondary prevention  . LEFT HEART CATHETERIZATION WITH CORONARY ANGIOGRAM N/A 07/02/2014   no CAD, normal LV function  . TOTAL HIP ARTHROPLASTY Right 07/08/2015   Procedure: RIGHT TOTAL HIP ARTHROPLASTY ANTERIOR APPROACH;  Surgeon: James Romans, MD;  Location:  [facility];  Service: Orthopedics;  Laterality: Right;  Marland Kitchen VASECTOMY       Family History  Problem Relation Age of Onset  . Alcohol abuse Mother   . Mental illness Father        committed suicide     Social History   Socioeconomic History  . Marital status: Married    Spouse name: Not on file  . Number of children: Not on file  . Years of education: Not on file  . Highest education level: Not on file  Occupational History  . Not on file  Tobacco Use  . Smoking status: Former Smoker    Years: 5.00    Types: Cigarettes    Quit date: 06/23/2013    Years since quitting: 7.0  . Smokeless tobacco: Never Used  Vaping Use  . Vaping Use: Never used  Substance and Sexual Activity  . Alcohol use: No  . Drug use: No  . Sexual activity: Yes  Other Topics Concern  . Not on file  Social History Narrative  . Not on file   Social Determinants of Health   Financial Resource Strain: Not on file  Food Insecurity: Not on file  Transportation Needs: Not on file  Physical Activity: Not on file  Stress: Not on file  Social Connections: Not on file  Intimate Partner Violence: Not on file     BP 132/88  Pulse 60   Ht 5\' 10"  (1.778 m)   Wt 200 lb 12.8 oz (91.1 kg)   SpO2 96%   BMI 28.81 kg/m   Physical Exam:  Well appearing NAD HEENT: Unremarkable Neck:  No JVD, no thyromegally Lymphatics:  No adenopathy Back:  No CVA tenderness Lungs:  Clear with no wheezes HEART:  Regular rate rhythm, no murmurs, no rubs, no clicks Abd:  soft, positive bowel sounds, no organomegally, no rebound, no guarding Ext:  2 plus pulses, no edema, no cyanosis, no clubbing Skin:  No rashes no nodules Neuro:  CN II through XII intact, motor grossly intact  EKG - nsr  DEVICE  Normal device function.  See PaceArt for details.   Assess/Plan: 1. VT - we had tried to uptitrate his sotalol from 120 to 180 twice daily but he developed severe muscle aches and had to go back to 120 bid. We discuss the  addition of mexitil and amio as well as catheter ablation. He will continue as he is "I feel good." and I will see him back in a year. If his VT comes back in the next couple of months, I would ask him to add mexitil. 2. ARVD - he will continue his current meds. He has no evidence of right or left sided CHF. 3. HTN - his bp is minimally elevated.  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  James Tavis,MD

## 2020-06-30 NOTE — Patient Instructions (Signed)

## 2020-07-04 LAB — CUP PACEART INCLINIC DEVICE CHECK
Battery Remaining Longevity: 46 mo
Brady Statistic RA Percent Paced: 3.1 %
Brady Statistic RV Percent Paced: 0.03 %
Date Time Interrogation Session: 20220207160700
HighPow Impedance: 76.5 Ohm
Implantable Lead Implant Date: 20160211
Implantable Lead Implant Date: 20160211
Implantable Lead Location: 753859
Implantable Lead Location: 753860
Implantable Lead Model: 7122
Implantable Pulse Generator Implant Date: 20160211
Lead Channel Impedance Value: 387.5 Ohm
Lead Channel Impedance Value: 512.5 Ohm
Lead Channel Pacing Threshold Amplitude: 0.75 V
Lead Channel Pacing Threshold Amplitude: 0.75 V
Lead Channel Pacing Threshold Amplitude: 0.75 V
Lead Channel Pacing Threshold Amplitude: 0.75 V
Lead Channel Pacing Threshold Pulse Width: 0.5 ms
Lead Channel Pacing Threshold Pulse Width: 0.5 ms
Lead Channel Pacing Threshold Pulse Width: 0.5 ms
Lead Channel Pacing Threshold Pulse Width: 0.5 ms
Lead Channel Sensing Intrinsic Amplitude: 10.4 mV
Lead Channel Sensing Intrinsic Amplitude: 3.9 mV
Lead Channel Setting Pacing Amplitude: 2 V
Lead Channel Setting Pacing Amplitude: 2.5 V
Lead Channel Setting Pacing Pulse Width: 0.5 ms
Lead Channel Setting Sensing Sensitivity: 0.5 mV
Pulse Gen Serial Number: 7230141

## 2020-07-07 ENCOUNTER — Telehealth: Payer: Self-pay

## 2020-07-07 NOTE — Telephone Encounter (Signed)
Merlin alert received for 4 treated events logged, 18-24 seconds in duration with rates in 170's.  VT successfully terminated by ATP x 1 each. EGM's suggest VT. Patient reports he has "been feeling great." States was sitting on the couch watching tv and he did have palpitations but did not feel bad during time. Reports during episode each day he was eating black licorice jelly beans (total of 1 lb bag). States he looked online and saw it could cause heart arrhythmias. Reports he will no longer eat them. Compliant with Sotalol 120 mg BID. Shock plan reviewed with patient with verbal understanding. Advised per [place] DMV he is advised not to drive x6 months starting 9/52/84.    Per Dr. Lubertha Basque note on 06/30/20 ~ "VT - we had tried to uptitrate his sotalol from 120 to 180 twice daily but he developed severe muscle aches and had to go back to 120 bid. We discuss the addition of mexitil and amio as well as catheter ablation. He will continue as he is "I feel good." and I will see him back in a year. If his VT comes back in the next couple of months, I would ask him to add mexitil."

## 2020-07-08 ENCOUNTER — Telehealth: Payer: Self-pay | Admitting: Emergency Medicine

## 2020-07-08 NOTE — Telephone Encounter (Signed)
VT noted.

## 2020-07-08 NOTE — Telephone Encounter (Signed)
Stop the jelly beans. Continue current meds. GT

## 2020-07-08 NOTE — Telephone Encounter (Signed)
Patient contacted due to alert for 3 VT episodes on 07/07/20 that resolved by ATP X1 , and 9 episodes of NSVT  that occurred between 1930 and 2054. Patient reports he was asymptomatic and was sitting on the couch watching TV during time of events. No missed doses of Sotolol 120 mg BID. Patient had 4 treated episodes of VT that successfully terminated with ATP on 07/06/20.  DMV driving restrictions and shock plan reviewed.

## 2020-07-09 ENCOUNTER — Telehealth: Payer: Self-pay | Admitting: Emergency Medicine

## 2020-07-09 NOTE — Telephone Encounter (Signed)
Patient reports periods of dizziness and light headedness associated with times of treated VT episodes. Patient was riding in a vehicle or relaxing on couch or in the bed during episodes. No missed dose of sotolol 120 mg BID. Will schedule with EP AP to evaluate.

## 2020-07-09 NOTE — Telephone Encounter (Signed)
Patient unavailable to take call . Message left to call DC.  Discuss symptoms associated with 5 additional episodes of VT successfully treated by ATP on 07/08/20. Since 06/30/20 patient has had 12 episodes of VT treated successfully by ATP.

## 2020-07-11 NOTE — Telephone Encounter (Signed)
I will probably need to see him back to discuss treatment options. GT

## 2020-07-14 NOTE — Telephone Encounter (Signed)
Left message on patient's voicemail to contact the Device Clinic [phone]. Patient needs assessment, to confirm medication compliance and, to discuss driving restrictions from ATP therapy received on 07/11/2020. Patient has an in office appointment with Dr. Lewayne Bunting on 07/21/2020 to discuss VT episodes and treatment.

## 2020-07-14 NOTE — Telephone Encounter (Signed)
We will discuss treatment options at the time of his clinic visit. GT

## 2020-07-14 NOTE — Telephone Encounter (Signed)
Patient returned call. Previous message left for patient in reference to VT episode treated with ATP therapy on 07/11/2020.    Patient also received ATP therapy on 07/12/20 and 07/13/20 for multiple VT episodes. Patient states that he is non-symptomatic today 07/14/20. Patient states that when these episodes happen he is not doing anything exerting or extensive. Patient states the only past symptoms he has had have consisted of dizziness and light headedness. Driving restrictions reviewed with patient starting over again on 07/13/20. Patient has an in office appointment with Dr. Lewayne Bunting on 07/21/2020. Device Clinic number [phone] given to patient if there are any concerns or questions.

## 2020-07-14 NOTE — Telephone Encounter (Signed)
Alert received 07/14/20- Pt has had additional episodes of treated VT- 2/19 and 2/20: 2 treated events logged 18 to 26 seconds w/ rates 170's to 180's bpm; EGMs suggest VT successfully terminated by ATP x 1-2. 19 nonsustained events logged 4 to 10 seconds w/ rates 170's bpm; EGMs suggest VT  Most recent Vt episode

## 2020-07-21 ENCOUNTER — Encounter: Payer: Self-pay | Admitting: Internal Medicine

## 2020-07-21 ENCOUNTER — Other Ambulatory Visit: Payer: Self-pay

## 2020-07-21 ENCOUNTER — Ambulatory Visit: Payer: BC Managed Care – PPO | Admitting: Internal Medicine

## 2020-07-21 VITALS — BP 110/80 | HR 50 | Ht 70.0 in | Wt 264.0 lb

## 2020-07-21 DIAGNOSIS — I472 Ventricular tachycardia, unspecified: Secondary | ICD-10-CM

## 2020-07-21 DIAGNOSIS — Z95810 Presence of automatic (implantable) cardiac defibrillator: Secondary | ICD-10-CM | POA: Diagnosis not present

## 2020-07-21 DIAGNOSIS — I429 Cardiomyopathy, unspecified: Secondary | ICD-10-CM

## 2020-07-21 MED ORDER — SOTALOL HCL AF 120 MG PO TABS: 120.0000 mg | ORAL_TABLET | Freq: Three times a day (TID) | ORAL | 3 refills | Status: DC

## 2020-07-21 NOTE — Progress Notes (Signed)
HPI Mr. Brannigan returns today for followup of his ICD and VT. He is a pleasant 50 yo man with ARVD, VT and dyslipidemia. He has had more VT in the interim and increased his sotalol to 120 tid. He could not tolerate the 180 bid previously tried. He denies chest pain or sob. No syncope. He is working out, swimming and activities are unlimited.  No edema.He has had several episodes of VT. Despite this he feels a little tired.  No Known Allergies   Current Outpatient Medications  Medication Sig Dispense Refill   atorvastatin (LIPITOR) 20 MG tablet Take 20 mg by mouth daily.      Sotalol HCl AF 120 MG TABS Take 1 tablet (120 mg total) by mouth in the morning, at noon, and at bedtime. 270 tablet 3   No current facility-administered medications for this visit.     Past Medical History:  Diagnosis Date   Arrhythmogenic right ventricular dysplasia (HCC)    a. 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   VT (ventricular tachycardia) (HCC)    a. 06/2014 ARVD s/p SJM dc AICD.-Dr. Rosette Reveal follows    ROS:   All systems reviewed and negative except as noted in the HPI.   Past Surgical History:  Procedure Laterality Date   arm surgery  1991   2 steel plates in left arm   ELECTROPHYSIOLOGY STUDY N/A 07/03/2014   EPS by Dr Graciela Husbands with inducible VT   IMPLANTABLE CARDIOVERTER DEFIBRILLATOR IMPLANT N/A 07/04/2014   STJ dual chamber ICD implanted by Dr Ladona Ridgel for secondary prevention   LEFT HEART CATHETERIZATION WITH CORONARY ANGIOGRAM N/A 07/02/2014   no CAD, normal LV function   TOTAL HIP ARTHROPLASTY Right 07/08/2015   Procedure: RIGHT TOTAL HIP ARTHROPLASTY ANTERIOR APPROACH;  Surgeon: Durene Romans, MD;  Location: [facility];  Service: Orthopedics;  Laterality: Right;   VASECTOMY       Family History  Problem  Relation Age of Onset   Alcohol abuse Mother    Mental illness Father        committed suicide     Social History   Socioeconomic History   Marital status: Married    Spouse name: Not on file   Number of children: Not on file   Years of education: Not on file   Highest education level: Not on file  Occupational History   Not on file  Tobacco Use   Smoking status: Former Smoker    Years: 5.00    Types: Cigarettes    Quit date: 06/23/2013    Years since quitting: 7.0   Smokeless tobacco: Never Used  Vaping Use   Vaping Use: Never used  Substance and Sexual Activity   Alcohol use: No   Drug use: No   Sexual activity: Yes  Other Topics Concern   Not on file  Social History Narrative   Not on file   Social Determinants of Health   Financial Resource Strain: Not on file  Food Insecurity: Not on file  Transportation Needs: Not on file  Physical Activity: Not on file  Stress: Not on file  Social Connections: Not on file  Intimate Partner Violence: Not on file     BP 110/80 (BP Location: Left Arm, Patient Position: Sitting, Cuff Size: Normal)    Pulse (!) 50  Ht 5\' 10"  (1.778 m)    Wt 264 lb (119.7 kg)    SpO2 95%    BMI 37.88 kg/m   Physical Exam:  Well appearing NAD HEENT: Unremarkable Neck:  No JVD, no thyromegally Lymphatics:  No adenopathy Back:  No CVA tenderness Lungs:  Clear HEART:  Regular rate rhythm, no murmurs, no rubs, no clicks Abd:  soft, positive bowel sounds, no organomegally, no rebound, no guarding Ext:  2 plus pulses, no edema, no cyanosis, no clubbing Skin:  No rashes no nodules Neuro:  CN II through XII intact, motor grossly intact  EKG  - sinus brady  DEVICE  Normal device function.  See PaceArt for details.   Assess/Plan: 1. VT - he has quieted down some since increasing the sotalol to 120 tid. We discussed catheter ablation as well as amiodarone. He will continue sotalol. 2. ICD - his St. Jude DDD ICD is working  normally. I considered reprogramming the device to treat VT at a lower rate but decided against it.  3. Sinus brady - this is a consequence of the sotalol. He is asymptomatic. We will consider increasing the pacing rate if he develops symptoms.  4. ARVD - he has no symptoms of CHF, either right or left sided.  Kelissa Merlin,MD

## 2020-07-21 NOTE — Patient Instructions (Addendum)
Medication Instructions:  Your physician has recommended you make the following change in your medication:  1.  INCREASE your sotalol 120 mg-  Take one tablet by mouth three times a day   Labwork: None ordered.  Testing/Procedures: None ordered.  Follow-Up: Your physician wants you to follow-up in: 3 months with Lewayne Bunting, MD   Oct 14, 2020 at 10:30 am at the [facility] office  Remote monitoring is used to monitor your ICD from home. This monitoring reduces the number of office visits required to check your device to one time per year. It allows [place] to keep an eye on the functioning of your device to ensure it is working properly. You are scheduled for a device check from home on 07/24/2020. You may send your transmission at any time that day. If you have a wireless device, the transmission will be sent automatically. After your physician reviews your transmission, you will receive a postcard with your next transmission date.  Any Other Special Instructions Will Be Listed Below (If Applicable).  If you need a refill on your cardiac medications before your next appointment, please call your pharmacy.

## 2020-07-24 ENCOUNTER — Ambulatory Visit (INDEPENDENT_AMBULATORY_CARE_PROVIDER_SITE_OTHER): Payer: BC Managed Care – PPO

## 2020-07-24 DIAGNOSIS — I429 Cardiomyopathy, unspecified: Secondary | ICD-10-CM | POA: Diagnosis not present

## 2020-07-28 LAB — CUP PACEART REMOTE DEVICE CHECK
Battery Remaining Longevity: 42 mo
Battery Remaining Percentage: 41 %
Battery Voltage: 2.89 V
Brady Statistic AP VP Percent: 1 %
Brady Statistic AP VS Percent: 9.3 %
Brady Statistic AS VP Percent: 1 %
Brady Statistic AS VS Percent: 90 %
Brady Statistic RA Percent Paced: 8.5 %
Brady Statistic RV Percent Paced: 1 %
Date Time Interrogation Session: 20220304115241
HighPow Impedance: 75 Ohm
HighPow Impedance: 75 Ohm
Implantable Lead Implant Date: 20160211
Implantable Lead Implant Date: 20160211
Implantable Lead Location: 753859
Implantable Lead Location: 753860
Implantable Lead Model: 7122
Implantable Pulse Generator Implant Date: 20160211
Lead Channel Impedance Value: 390 Ohm
Lead Channel Impedance Value: 480 Ohm
Lead Channel Pacing Threshold Amplitude: 0.75 V
Lead Channel Pacing Threshold Amplitude: 0.75 V
Lead Channel Pacing Threshold Pulse Width: 0.5 ms
Lead Channel Pacing Threshold Pulse Width: 0.5 ms
Lead Channel Sensing Intrinsic Amplitude: 10.9 mV
Lead Channel Sensing Intrinsic Amplitude: 4.3 mV
Lead Channel Setting Pacing Amplitude: 2 V
Lead Channel Setting Pacing Amplitude: 2.5 V
Lead Channel Setting Pacing Pulse Width: 0.5 ms
Lead Channel Setting Sensing Sensitivity: 0.5 mV
Pulse Gen Serial Number: 7230141

## 2020-08-04 NOTE — Progress Notes (Signed)
Remote ICD transmission.   

## 2020-08-27 NOTE — Addendum Note (Signed)
Addended by: Louanne Belton, Shakina Choy A on: 08/27/2020 09:10 AM   Modules accepted: Orders

## 2020-10-10 ENCOUNTER — Telehealth: Payer: Self-pay

## 2020-10-10 NOTE — Telephone Encounter (Signed)
15 NSVT episodes over 6 days since last remote. Longest 14 sec./ 66 beats @ 170's. No therapies delivered.  Patient reports he was asymptomatic and no aware of any events. Complaint with sotalol. Patient has apt. With Dr. Ladona Ridgel on 10/14/20. Patient aware and reports he will be here.

## 2020-10-13 NOTE — Telephone Encounter (Signed)
No change 

## 2020-10-14 ENCOUNTER — Encounter: Payer: Self-pay | Admitting: Internal Medicine

## 2020-10-14 ENCOUNTER — Other Ambulatory Visit: Payer: Self-pay

## 2020-10-14 ENCOUNTER — Ambulatory Visit (INDEPENDENT_AMBULATORY_CARE_PROVIDER_SITE_OTHER): Payer: BC Managed Care – PPO | Admitting: Internal Medicine

## 2020-10-14 VITALS — BP 128/80 | HR 49 | Ht 70.0 in | Wt 203.4 lb

## 2020-10-14 DIAGNOSIS — Z95810 Presence of automatic (implantable) cardiac defibrillator: Secondary | ICD-10-CM

## 2020-10-14 DIAGNOSIS — I429 Cardiomyopathy, unspecified: Secondary | ICD-10-CM

## 2020-10-14 DIAGNOSIS — I472 Ventricular tachycardia, unspecified: Secondary | ICD-10-CM

## 2020-10-14 NOTE — Patient Instructions (Addendum)
Medication Instructions:  Your physician recommends that you continue on your current medications as directed. Please refer to the Current Medication list given to you today.  Labwork: None ordered.  Testing/Procedures: None ordered.  Follow-Up: Your physician wants you to follow-up in: 6 months with Lewayne Bunting, MD or one of the following Advanced Practice Providers on your designated Care Team:    Gypsy Balsam, NP  Francis Dowse, PA-C  Casimiro Needle "Mardelle Matte" [place], [place]  Remote monitoring is used to monitor your ICD from home. This monitoring reduces the number of office visits required to check your device to one time per year. It allows [place] to keep an eye on the functioning of your device to ensure it is working properly. You are scheduled for a device check from home on 10/23/2020. You may send your transmission at any time that day. If you have a wireless device, the transmission will be sent automatically. After your physician reviews your transmission, you will receive a postcard with your next transmission date.  Any Other Special Instructions Will Be Listed Below (If Applicable).  If you need a refill on your cardiac medications before your next appointment, please call your pharmacy.

## 2020-10-14 NOTE — Progress Notes (Signed)
HPI [name] returns today for followup of his ICD and VT. He is a pleasant 50yo man with ARVD, VT and dyslipidemia. He continues to have more VT in the interim and increased his sotalol to 120 tid. He could not tolerate the 180 bid previously tried. He denies chest pain or sob. No syncope. He is working out, swimming and activities are unlimited.  No edema.He has had several episodes of VT. Since his last check these have been non-sustained and are now much slower. No Known Allergies   Current Outpatient Medications  Medication Sig Dispense Refill  . atorvastatin (LIPITOR) 20 MG tablet Take 20 mg by mouth daily.     . Sotalol HCl AF 120 MG TABS Take 1 tablet (120 mg total) by mouth in the morning, at noon, and at bedtime. 270 tablet 3   No current facility-administered medications for this visit.     Past Medical History:  Diagnosis Date  . Arrhythmogenic right ventricular dysplasia (HCC)    a. diagnosed by MRI 06/2014 following presentation with hemodynamically unstable VT  . Hyperlipidemia   . Hypertension   . 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  . VT (ventricular tachycardia) (HCC)    a. 06/2014 ARVD s/p SJM dc AICD.-Dr. Rosette Reveal follows    ROS:   All systems reviewed and negative except as noted in the HPI.   Past Surgical History:  Procedure Laterality Date  . arm surgery  1991   2 steel plates in left arm  . ELECTROPHYSIOLOGY STUDY N/A 07/03/2014   EPS by Dr Graciela Husbands with inducible VT  . IMPLANTABLE CARDIOVERTER DEFIBRILLATOR IMPLANT N/A 07/04/2014   STJ dual chamber ICD implanted by Dr Ladona Ridgel for secondary prevention  . LEFT HEART CATHETERIZATION WITH CORONARY ANGIOGRAM N/A 07/02/2014   no CAD, normal LV function  . TOTAL HIP ARTHROPLASTY Right 07/08/2015   Procedure: RIGHT TOTAL HIP ARTHROPLASTY ANTERIOR APPROACH;  Surgeon: Durene Romans, MD;  Location: [facility];  Service: Orthopedics;  Laterality: Right;  Marland Kitchen  VASECTOMY       Family History  Problem Relation Age of Onset  . Alcohol abuse Mother   . Mental illness Father        committed suicide     Social History   Socioeconomic History  . Marital status: Married    Spouse name: Not on file  . Number of children: Not on file  . Years of education: Not on file  . Highest education level: Not on file  Occupational History  . Not on file  Tobacco Use  . Smoking status: Former Smoker    Years: 5.00    Types: Cigarettes    Quit date: 06/23/2013    Years since quitting: 7.3  . Smokeless tobacco: Never Used  Vaping Use  . Vaping Use: Never used  Substance and Sexual Activity  . Alcohol use: No  . Drug use: No  . Sexual activity: Yes  Other Topics Concern  . Not on file  Social History Narrative  . Not on file   Social Determinants of Health   Financial Resource Strain: Not on file  Food Insecurity: Not on file  Transportation Needs: Not on file  Physical Activity: Not on file  Stress: Not on file  Social Connections: Not on file  Intimate Partner Violence: Not on file     BP 128/80   Pulse (!) 49   Ht 5\' 10"  (1.778 m)  Wt 203 lb 6.4 oz (92.3 kg)   SpO2 96%   BMI 29.18 kg/m   Physical Exam:  Well appearing NAD HEENT: Unremarkable Neck:  No JVD, no thyromegally Lymphatics:  No adenopathy Back:  No CVA tenderness Lungs:  Clear with no wheezes HEART:  Regular rate rhythm, no murmurs, no rubs, no clicks Abd:  soft, positive bowel sounds, no organomegally, no rebound, no guarding Ext:  2 plus pulses, no edema, no cyanosis, no clubbing Skin:  No rashes no nodules Neuro:  CN II through XII intact, motor grossly intact  EKG - sinus bradycardia  DEVICE  Normal device function.  See PaceArt for details.   Assess/Plan: 1. VT - he has quieted down some since increasing the sotalol to 120 tid. We discussed catheter ablation as well as amiodarone. He will continue sotalol. 2. ICD - his St. Jude DDD ICD is working  normally. I considered reprogramming the device to treat VT at a lower rate but decided against it.  3. Sinus brady - this is a consequence of the sotalol. He is asymptomatic. We will consider increasing the pacing rate if he develops symptoms.  4. ARVD - he has no symptoms of CHF, either right or left sided.  James Fowler James Steffensmeier,MD

## 2020-10-20 LAB — CUP PACEART REMOTE DEVICE CHECK
Battery Remaining Longevity: 40 mo
Battery Remaining Percentage: 39 %
Battery Voltage: 2.87 V
Brady Statistic AP VP Percent: 1 %
Brady Statistic AP VS Percent: 4.4 %
Brady Statistic AS VP Percent: 1 %
Brady Statistic AS VS Percent: 95 %
Brady Statistic RA Percent Paced: 3.9 %
Brady Statistic RV Percent Paced: 1 %
Date Time Interrogation Session: 20220529020017
HighPow Impedance: 68 Ohm
HighPow Impedance: 68 Ohm
Implantable Lead Implant Date: 20160211
Implantable Lead Implant Date: 20160211
Implantable Lead Location: 753859
Implantable Lead Location: 753860
Implantable Lead Model: 7122
Implantable Pulse Generator Implant Date: 20160211
Lead Channel Impedance Value: 390 Ohm
Lead Channel Impedance Value: 460 Ohm
Lead Channel Pacing Threshold Amplitude: 0.75 V
Lead Channel Pacing Threshold Amplitude: 0.75 V
Lead Channel Pacing Threshold Pulse Width: 0.5 ms
Lead Channel Pacing Threshold Pulse Width: 0.5 ms
Lead Channel Sensing Intrinsic Amplitude: 10.9 mV
Lead Channel Sensing Intrinsic Amplitude: 3.2 mV
Lead Channel Setting Pacing Amplitude: 2 V
Lead Channel Setting Pacing Amplitude: 2.5 V
Lead Channel Setting Pacing Pulse Width: 0.5 ms
Lead Channel Setting Sensing Sensitivity: 0.5 mV
Pulse Gen Serial Number: 7230141

## 2020-10-21 NOTE — Telephone Encounter (Signed)
Alert received for ATP therapy received for VT on 10/18/20. Patient nonsymptomatic today and states that he was sitting on the couch on 10/18/20 and watching TV and that he felt the ATP therapy. I reviewed driving restrictions 6 months from the 28 th per [place] DMV and shock plan reviewed. Patient compliant with medications. Sending to Dr. Ladona Ridgel  For review and recommendations.

## 2020-10-23 ENCOUNTER — Ambulatory Visit (INDEPENDENT_AMBULATORY_CARE_PROVIDER_SITE_OTHER): Payer: BC Managed Care – PPO

## 2020-10-23 DIAGNOSIS — I429 Cardiomyopathy, unspecified: Secondary | ICD-10-CM | POA: Diagnosis not present

## 2020-11-03 NOTE — Telephone Encounter (Signed)
No change in treatment

## 2020-11-04 ENCOUNTER — Other Ambulatory Visit: Payer: Self-pay | Admitting: Internal Medicine

## 2020-11-17 NOTE — Progress Notes (Signed)
Remote ICD transmission.   

## 2021-01-22 ENCOUNTER — Ambulatory Visit (INDEPENDENT_AMBULATORY_CARE_PROVIDER_SITE_OTHER): Payer: BC Managed Care – PPO

## 2021-01-22 DIAGNOSIS — I429 Cardiomyopathy, unspecified: Secondary | ICD-10-CM | POA: Diagnosis not present

## 2021-01-27 LAB — CUP PACEART REMOTE DEVICE CHECK
Battery Remaining Longevity: 38 mo
Battery Remaining Percentage: 37 %
Battery Voltage: 2.89 V
Brady Statistic AP VP Percent: 1 %
Brady Statistic AP VS Percent: 5.7 %
Brady Statistic AS VP Percent: 1 %
Brady Statistic AS VS Percent: 93 %
Brady Statistic RA Percent Paced: 5.1 %
Brady Statistic RV Percent Paced: 1 %
Date Time Interrogation Session: 20220901020016
HighPow Impedance: 77 Ohm
HighPow Impedance: 77 Ohm
Implantable Lead Implant Date: 20160211
Implantable Lead Implant Date: 20160211
Implantable Lead Location: 753859
Implantable Lead Location: 753860
Implantable Lead Model: 7122
Implantable Pulse Generator Implant Date: 20160211
Lead Channel Impedance Value: 380 Ohm
Lead Channel Impedance Value: 480 Ohm
Lead Channel Pacing Threshold Amplitude: 0.75 V
Lead Channel Pacing Threshold Amplitude: 0.75 V
Lead Channel Pacing Threshold Pulse Width: 0.5 ms
Lead Channel Pacing Threshold Pulse Width: 0.5 ms
Lead Channel Sensing Intrinsic Amplitude: 10.3 mV
Lead Channel Sensing Intrinsic Amplitude: 3.2 mV
Lead Channel Setting Pacing Amplitude: 2 V
Lead Channel Setting Pacing Amplitude: 2.5 V
Lead Channel Setting Pacing Pulse Width: 0.5 ms
Lead Channel Setting Sensing Sensitivity: 0.5 mV
Pulse Gen Serial Number: 7230141

## 2021-02-03 NOTE — Progress Notes (Signed)
Remote ICD transmission.   

## 2021-04-23 ENCOUNTER — Ambulatory Visit (INDEPENDENT_AMBULATORY_CARE_PROVIDER_SITE_OTHER): Payer: BC Managed Care – PPO

## 2021-04-23 DIAGNOSIS — I472 Ventricular tachycardia, unspecified: Secondary | ICD-10-CM | POA: Diagnosis not present

## 2021-04-23 LAB — CUP PACEART REMOTE DEVICE CHECK
Battery Remaining Longevity: 37 mo
Battery Remaining Percentage: 36 %
Battery Voltage: 2.87 V
Brady Statistic AP VP Percent: 1 %
Brady Statistic AP VS Percent: 6.8 %
Brady Statistic AS VP Percent: 1 %
Brady Statistic AS VS Percent: 93 %
Brady Statistic RA Percent Paced: 6.2 %
Brady Statistic RV Percent Paced: 1 %
Date Time Interrogation Session: 20221201031504
HighPow Impedance: 74 Ohm
HighPow Impedance: 74 Ohm
Implantable Lead Implant Date: 20160211
Implantable Lead Implant Date: 20160211
Implantable Lead Location: 753859
Implantable Lead Location: 753860
Implantable Lead Model: 7122
Implantable Pulse Generator Implant Date: 20160211
Lead Channel Impedance Value: 390 Ohm
Lead Channel Impedance Value: 510 Ohm
Lead Channel Pacing Threshold Amplitude: 0.75 V
Lead Channel Pacing Threshold Amplitude: 0.75 V
Lead Channel Pacing Threshold Pulse Width: 0.5 ms
Lead Channel Pacing Threshold Pulse Width: 0.5 ms
Lead Channel Sensing Intrinsic Amplitude: 3 mV
Lead Channel Sensing Intrinsic Amplitude: 9.7 mV
Lead Channel Setting Pacing Amplitude: 2 V
Lead Channel Setting Pacing Amplitude: 2.5 V
Lead Channel Setting Pacing Pulse Width: 0.5 ms
Lead Channel Setting Sensing Sensitivity: 0.5 mV
Pulse Gen Serial Number: 7230141

## 2021-05-04 NOTE — Progress Notes (Signed)
Remote ICD transmission.   

## 2021-07-23 ENCOUNTER — Ambulatory Visit (INDEPENDENT_AMBULATORY_CARE_PROVIDER_SITE_OTHER): Payer: BC Managed Care – PPO

## 2021-07-23 DIAGNOSIS — I472 Ventricular tachycardia, unspecified: Secondary | ICD-10-CM

## 2021-07-24 ENCOUNTER — Other Ambulatory Visit: Payer: Self-pay | Admitting: Internal Medicine

## 2021-07-24 LAB — CUP PACEART REMOTE DEVICE CHECK
Battery Remaining Longevity: 34 mo
Battery Remaining Percentage: 33 %
Battery Voltage: 2.86 V
Brady Statistic AP VP Percent: 1 %
Brady Statistic AP VS Percent: 5.9 %
Brady Statistic AS VP Percent: 1 %
Brady Statistic AS VS Percent: 94 %
Brady Statistic RA Percent Paced: 5.3 %
Brady Statistic RV Percent Paced: 1 %
Date Time Interrogation Session: 20230302020018
HighPow Impedance: 74 Ohm
HighPow Impedance: 74 Ohm
Implantable Lead Implant Date: 20160211
Implantable Lead Implant Date: 20160211
Implantable Lead Location: 753859
Implantable Lead Location: 753860
Implantable Lead Model: 7122
Implantable Pulse Generator Implant Date: 20160211
Lead Channel Impedance Value: 360 Ohm
Lead Channel Impedance Value: 460 Ohm
Lead Channel Pacing Threshold Amplitude: 0.75 V
Lead Channel Pacing Threshold Amplitude: 0.75 V
Lead Channel Pacing Threshold Pulse Width: 0.5 ms
Lead Channel Pacing Threshold Pulse Width: 0.5 ms
Lead Channel Sensing Intrinsic Amplitude: 10 mV
Lead Channel Sensing Intrinsic Amplitude: 3.4 mV
Lead Channel Setting Pacing Amplitude: 2 V
Lead Channel Setting Pacing Amplitude: 2.5 V
Lead Channel Setting Pacing Pulse Width: 0.5 ms
Lead Channel Setting Sensing Sensitivity: 0.5 mV
Pulse Gen Serial Number: 7230141

## 2021-07-30 NOTE — Progress Notes (Signed)
Remote ICD transmission.   

## 2021-10-22 ENCOUNTER — Ambulatory Visit (INDEPENDENT_AMBULATORY_CARE_PROVIDER_SITE_OTHER): Payer: BC Managed Care – PPO

## 2021-10-22 ENCOUNTER — Other Ambulatory Visit: Payer: Self-pay | Admitting: Internal Medicine

## 2021-10-22 DIAGNOSIS — I472 Ventricular tachycardia, unspecified: Secondary | ICD-10-CM | POA: Diagnosis not present

## 2021-10-22 LAB — CUP PACEART REMOTE DEVICE CHECK
Battery Remaining Longevity: 31 mo
Battery Remaining Percentage: 31 %
Battery Voltage: 2.84 V
Brady Statistic AP VP Percent: 1 %
Brady Statistic AP VS Percent: 5.1 %
Brady Statistic AS VP Percent: 1 %
Brady Statistic AS VS Percent: 94 %
Brady Statistic RA Percent Paced: 4.6 %
Brady Statistic RV Percent Paced: 1 %
Date Time Interrogation Session: 20230601031754
HighPow Impedance: 74 Ohm
HighPow Impedance: 74 Ohm
Implantable Lead Implant Date: 20160211
Implantable Lead Implant Date: 20160211
Implantable Lead Location: 753859
Implantable Lead Location: 753860
Implantable Lead Model: 7122
Implantable Pulse Generator Implant Date: 20160211
Lead Channel Impedance Value: 360 Ohm
Lead Channel Impedance Value: 460 Ohm
Lead Channel Pacing Threshold Amplitude: 0.75 V
Lead Channel Pacing Threshold Amplitude: 0.75 V
Lead Channel Pacing Threshold Pulse Width: 0.5 ms
Lead Channel Pacing Threshold Pulse Width: 0.5 ms
Lead Channel Sensing Intrinsic Amplitude: 3.7 mV
Lead Channel Sensing Intrinsic Amplitude: 9.7 mV
Lead Channel Setting Pacing Amplitude: 2 V
Lead Channel Setting Pacing Amplitude: 2.5 V
Lead Channel Setting Pacing Pulse Width: 0.5 ms
Lead Channel Setting Sensing Sensitivity: 0.5 mV
Pulse Gen Serial Number: 7230141

## 2021-10-30 NOTE — Progress Notes (Signed)
Remote ICD transmission.   

## 2021-11-09 ENCOUNTER — Ambulatory Visit: Payer: BC Managed Care – PPO | Admitting: Internal Medicine

## 2021-11-09 ENCOUNTER — Encounter: Payer: Self-pay | Admitting: Internal Medicine

## 2021-11-09 VITALS — BP 122/78 | HR 52 | Ht 71.0 in | Wt 200.0 lb

## 2021-11-09 DIAGNOSIS — Z95810 Presence of automatic (implantable) cardiac defibrillator: Secondary | ICD-10-CM | POA: Diagnosis not present

## 2021-11-09 DIAGNOSIS — I429 Cardiomyopathy, unspecified: Secondary | ICD-10-CM | POA: Diagnosis not present

## 2021-11-09 DIAGNOSIS — I472 Ventricular tachycardia, unspecified: Secondary | ICD-10-CM | POA: Diagnosis not present

## 2021-11-09 LAB — CUP PACEART INCLINIC DEVICE CHECK
Battery Remaining Longevity: 33 mo
Brady Statistic RA Percent Paced: 4.6 %
Brady Statistic RV Percent Paced: 0.1 %
Date Time Interrogation Session: 20230619164637
HighPow Impedance: 81 Ohm
Implantable Lead Implant Date: 20160211
Implantable Lead Implant Date: 20160211
Implantable Lead Location: 753859
Implantable Lead Location: 753860
Implantable Lead Model: 7122
Implantable Pulse Generator Implant Date: 20160211
Lead Channel Impedance Value: 375 Ohm
Lead Channel Impedance Value: 462.5 Ohm
Lead Channel Pacing Threshold Amplitude: 0.75 V
Lead Channel Pacing Threshold Amplitude: 0.75 V
Lead Channel Pacing Threshold Amplitude: 0.75 V
Lead Channel Pacing Threshold Amplitude: 0.75 V
Lead Channel Pacing Threshold Pulse Width: 0.5 ms
Lead Channel Pacing Threshold Pulse Width: 0.5 ms
Lead Channel Pacing Threshold Pulse Width: 0.5 ms
Lead Channel Pacing Threshold Pulse Width: 0.5 ms
Lead Channel Sensing Intrinsic Amplitude: 10.2 mV
Lead Channel Sensing Intrinsic Amplitude: 3.9 mV
Lead Channel Setting Pacing Amplitude: 2 V
Lead Channel Setting Pacing Amplitude: 2.5 V
Lead Channel Setting Pacing Pulse Width: 0.5 ms
Lead Channel Setting Sensing Sensitivity: 0.5 mV
Pulse Gen Serial Number: 7230141

## 2021-11-09 NOTE — Progress Notes (Signed)
HPI James Fowler returns today for followup of his ICD and VT. He is a pleasant 51 yo man with ARVD, VT and dyslipidemia. He continues to have more VT in the interim and increased his sotalol to 120 tid. He could not tolerate the 180 bid previously tried. He denies chest pain or sob. No syncope. He is working out, swimming and activities are unlimited.  No edema. He has had no VT.   No Known Allergies   Current Outpatient Medications  Medication Sig Dispense Refill   atorvastatin (LIPITOR) 20 MG tablet Take 20 mg by mouth daily.      SOTALOL AF 120 MG TABS TAKE 1 TABLET BY MOUTH IN THE MORNING, NOON, AND BEDTIME 90 tablet 0   No current facility-administered medications for this visit.     Past Medical History:  Diagnosis Date   Arrhythmogenic right ventricular dysplasia (HCC)    a. 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   VT (ventricular tachycardia) (HCC)    a. 06/2014 ARVD s/p SJM dc AICD.-Dr. Rosette Reveal follows    ROS:   All systems reviewed and negative except as noted in the HPI.   Past Surgical History:  Procedure Laterality Date   arm surgery  1991   2 steel plates in left arm   ELECTROPHYSIOLOGY STUDY N/A 07/03/2014   EPS by Dr Graciela Husbands with inducible VT   IMPLANTABLE CARDIOVERTER DEFIBRILLATOR IMPLANT N/A 07/04/2014   STJ dual chamber ICD implanted by Dr Ladona Ridgel for secondary prevention   LEFT HEART CATHETERIZATION WITH CORONARY ANGIOGRAM N/A 07/02/2014   no CAD, normal LV function   TOTAL HIP ARTHROPLASTY Right 07/08/2015   Procedure: RIGHT TOTAL HIP ARTHROPLASTY ANTERIOR APPROACH;  Surgeon: Durene Romans, MD;  Location: [facility];  Service: Orthopedics;  Laterality: Right;   VASECTOMY       Family History  Problem Relation Age of Onset   Alcohol abuse Mother    Mental illness Father         committed suicide     Social History   Socioeconomic History   Marital status: Married    Spouse name: Not on file   Number of children: Not on file   Years of education: Not on file   Highest education level: Not on file  Occupational History   Not on file  Tobacco Use   Smoking status: Former    Years: 5.00    Types: Cigarettes    Quit date: 06/23/2013    Years since quitting: 8.3   Smokeless tobacco: Never  Vaping Use   Vaping Use: Never used  Substance and Sexual Activity   Alcohol use: No   Drug use: No   Sexual activity: Yes  Other Topics Concern   Not on file  Social History Narrative   Not on file   Social Determinants of Health   Financial Resource Strain: Not on file  Food Insecurity: Not on file  Transportation Needs: Not on file  Physical Activity: Not on file  Stress: Not on file  Social Connections: Not on file  Intimate Partner Violence: Not on file     BP 122/78   Pulse (!) 52   Ht 5\' 11"  (1.803 m)   Wt 200 lb (90.7 kg)   SpO2 97%   BMI 27.89 kg/m   Physical Exam:  Well appearing NAD HEENT: Unremarkable Neck:  No JVD, no thyromegally Lymphatics:  No adenopathy Back:  No CVA tenderness Lungs:  Clear HEART:  Regular rate rhythm, no murmurs, no rubs, no clicks Abd:  soft, positive bowel sounds, no organomegally, no rebound, no guarding Ext:  2 plus pulses, no edema, no cyanosis, no clubbing Skin:  No rashes no nodules Neuro:  CN II through XII intact, right HP  DEVICE  Normal device function.  See PaceArt for details.   Assess/Plan:  1. VT - he has quieted down some since increasing the sotalol to 120 tid. We discussed catheter ablation as well as amiodarone. He will continue sotalol. 2. ICD - his St. Jude DDD ICD is working normally. I considered reprogramming the device to treat VT at a lower rate but decided against it.  3. Sinus brady - this is a consequence of the sotalol. He is asymptomatic.  4. ARVD - he has no symptoms of  CHF, either right or left sided.   James Gowda Bernadetta Roell,MD

## 2021-11-09 NOTE — Patient Instructions (Signed)
Medication Instructions:  Your physician recommends that you continue on your current medications as directed. Please refer to the Current Medication list given to you today.  Labwork: None ordered.  Testing/Procedures: None ordered.  Follow-Up: Your physician wants you to follow-up in: one year with Lewayne Bunting, MD or one of the following Advanced Practice Providers on your designated Care Team:   Francis Dowse, [place] Casimiro Needle "Mardelle Matte" Lanna Poche, [place]  Remote monitoring is used to monitor your ICD from home. This monitoring reduces the number of office visits required to check your device to one time per year. It allows [place] to keep an eye on the functioning of your device to ensure it is working properly. You are scheduled for a device check from home on 01/21/22. You may send your transmission at any time that day. If you have a wireless device, the transmission will be sent automatically. After your physician reviews your transmission, you will receive a postcard with your next transmission date.  Any Other Special Instructions Will Be Listed Below (If Applicable).  If you need a refill on your cardiac medications before your next appointment, please call your pharmacy.   Important Information About Sugar

## 2021-11-22 ENCOUNTER — Other Ambulatory Visit: Payer: Self-pay | Admitting: Internal Medicine

## 2021-12-10 ENCOUNTER — Telehealth: Payer: Self-pay

## 2021-12-10 NOTE — Telephone Encounter (Signed)
Request brought to office by Pt for records to be sent for disability benefits.  This document was first faxed on November 09, 2021.  Will REFAX today and mark as urgent.  Left message for Pt advising refaxing.

## 2022-01-21 ENCOUNTER — Ambulatory Visit (INDEPENDENT_AMBULATORY_CARE_PROVIDER_SITE_OTHER): Payer: BC Managed Care – PPO

## 2022-01-21 DIAGNOSIS — I429 Cardiomyopathy, unspecified: Secondary | ICD-10-CM | POA: Diagnosis not present

## 2022-01-21 LAB — CUP PACEART REMOTE DEVICE CHECK
Battery Remaining Longevity: 28 mo
Battery Remaining Percentage: 28 %
Battery Voltage: 2.83 V
Brady Statistic AP VP Percent: 1 %
Brady Statistic AP VS Percent: 4.9 %
Brady Statistic AS VP Percent: 1 %
Brady Statistic AS VS Percent: 95 %
Brady Statistic RA Percent Paced: 4.4 %
Brady Statistic RV Percent Paced: 1 %
Date Time Interrogation Session: 20230831020019
HighPow Impedance: 71 Ohm
HighPow Impedance: 71 Ohm
Implantable Lead Implant Date: 20160211
Implantable Lead Implant Date: 20160211
Implantable Lead Location: 753859
Implantable Lead Location: 753860
Implantable Lead Model: 7122
Implantable Pulse Generator Implant Date: 20160211
Lead Channel Impedance Value: 380 Ohm
Lead Channel Impedance Value: 460 Ohm
Lead Channel Pacing Threshold Amplitude: 0.75 V
Lead Channel Pacing Threshold Amplitude: 0.75 V
Lead Channel Pacing Threshold Pulse Width: 0.5 ms
Lead Channel Pacing Threshold Pulse Width: 0.5 ms
Lead Channel Sensing Intrinsic Amplitude: 3.2 mV
Lead Channel Sensing Intrinsic Amplitude: 9.5 mV
Lead Channel Setting Pacing Amplitude: 2 V
Lead Channel Setting Pacing Amplitude: 2.5 V
Lead Channel Setting Pacing Pulse Width: 0.5 ms
Lead Channel Setting Sensing Sensitivity: 0.5 mV
Pulse Gen Serial Number: 7230141

## 2022-02-11 NOTE — Progress Notes (Signed)
Remote ICD transmission.   

## 2022-04-22 ENCOUNTER — Ambulatory Visit (INDEPENDENT_AMBULATORY_CARE_PROVIDER_SITE_OTHER): Payer: BC Managed Care – PPO

## 2022-04-22 DIAGNOSIS — Z95810 Presence of automatic (implantable) cardiac defibrillator: Secondary | ICD-10-CM | POA: Diagnosis not present

## 2022-04-22 DIAGNOSIS — I429 Cardiomyopathy, unspecified: Secondary | ICD-10-CM

## 2022-04-24 LAB — CUP PACEART REMOTE DEVICE CHECK
Battery Remaining Longevity: 25 mo
Battery Remaining Percentage: 24 %
Battery Voltage: 2.8 V
Brady Statistic AP VP Percent: 1 %
Brady Statistic AP VS Percent: 3.9 %
Brady Statistic AS VP Percent: 1 %
Brady Statistic AS VS Percent: 96 %
Brady Statistic RA Percent Paced: 3.5 %
Brady Statistic RV Percent Paced: 1 %
Date Time Interrogation Session: 20231130020017
HighPow Impedance: 75 Ohm
HighPow Impedance: 75 Ohm
Implantable Lead Connection Status: 753985
Implantable Lead Connection Status: 753985
Implantable Lead Implant Date: 20160211
Implantable Lead Implant Date: 20160211
Implantable Lead Location: 753859
Implantable Lead Location: 753860
Implantable Lead Model: 7122
Implantable Pulse Generator Implant Date: 20160211
Lead Channel Impedance Value: 380 Ohm
Lead Channel Impedance Value: 460 Ohm
Lead Channel Pacing Threshold Amplitude: 0.75 V
Lead Channel Pacing Threshold Amplitude: 0.75 V
Lead Channel Pacing Threshold Pulse Width: 0.5 ms
Lead Channel Pacing Threshold Pulse Width: 0.5 ms
Lead Channel Sensing Intrinsic Amplitude: 3.4 mV
Lead Channel Sensing Intrinsic Amplitude: 9.7 mV
Lead Channel Setting Pacing Amplitude: 2 V
Lead Channel Setting Pacing Amplitude: 2.5 V
Lead Channel Setting Pacing Pulse Width: 0.5 ms
Lead Channel Setting Sensing Sensitivity: 0.5 mV
Pulse Gen Serial Number: 7230141

## 2022-05-13 NOTE — Progress Notes (Signed)
Remote ICD transmission.   

## 2022-07-22 ENCOUNTER — Ambulatory Visit (INDEPENDENT_AMBULATORY_CARE_PROVIDER_SITE_OTHER): Payer: Medicaid Other

## 2022-07-22 DIAGNOSIS — I429 Cardiomyopathy, unspecified: Secondary | ICD-10-CM

## 2022-07-22 LAB — CUP PACEART REMOTE DEVICE CHECK
Battery Remaining Longevity: 22 mo
Battery Remaining Percentage: 21 %
Battery Voltage: 2.78 V
Brady Statistic AP VP Percent: 1 %
Brady Statistic AP VS Percent: 3.1 %
Brady Statistic AS VP Percent: 1 %
Brady Statistic AS VS Percent: 96 %
Brady Statistic RA Percent Paced: 2.8 %
Brady Statistic RV Percent Paced: 1 %
Date Time Interrogation Session: 20240229034540
HighPow Impedance: 72 Ohm
HighPow Impedance: 72 Ohm
Implantable Lead Connection Status: 753985
Implantable Lead Connection Status: 753985
Implantable Lead Implant Date: 20160211
Implantable Lead Implant Date: 20160211
Implantable Lead Location: 753859
Implantable Lead Location: 753860
Implantable Lead Model: 7122
Implantable Pulse Generator Implant Date: 20160211
Lead Channel Impedance Value: 360 Ohm
Lead Channel Impedance Value: 460 Ohm
Lead Channel Pacing Threshold Amplitude: 0.75 V
Lead Channel Pacing Threshold Amplitude: 0.75 V
Lead Channel Pacing Threshold Pulse Width: 0.5 ms
Lead Channel Pacing Threshold Pulse Width: 0.5 ms
Lead Channel Sensing Intrinsic Amplitude: 3.1 mV
Lead Channel Sensing Intrinsic Amplitude: 9.2 mV
Lead Channel Setting Pacing Amplitude: 2 V
Lead Channel Setting Pacing Amplitude: 2.5 V
Lead Channel Setting Pacing Pulse Width: 0.5 ms
Lead Channel Setting Sensing Sensitivity: 0.5 mV
Pulse Gen Serial Number: 7230141

## 2022-08-20 NOTE — Progress Notes (Signed)
Remote ICD transmission.   

## 2022-10-21 ENCOUNTER — Ambulatory Visit (INDEPENDENT_AMBULATORY_CARE_PROVIDER_SITE_OTHER): Payer: Medicaid Other

## 2022-10-21 DIAGNOSIS — I429 Cardiomyopathy, unspecified: Secondary | ICD-10-CM | POA: Diagnosis not present

## 2022-10-22 LAB — CUP PACEART REMOTE DEVICE CHECK
Battery Remaining Longevity: 18 mo
Battery Remaining Percentage: 19 %
Battery Voltage: 2.77 V
Brady Statistic AP VP Percent: 1 %
Brady Statistic AP VS Percent: 3.2 %
Brady Statistic AS VP Percent: 1 %
Brady Statistic AS VS Percent: 96 %
Brady Statistic RA Percent Paced: 2.9 %
Brady Statistic RV Percent Paced: 1 %
Date Time Interrogation Session: 20240530060116
HighPow Impedance: 77 Ohm
HighPow Impedance: 77 Ohm
Implantable Lead Connection Status: 753985
Implantable Lead Connection Status: 753985
Implantable Lead Implant Date: 20160211
Implantable Lead Implant Date: 20160211
Implantable Lead Location: 753859
Implantable Lead Location: 753860
Implantable Lead Model: 7122
Implantable Pulse Generator Implant Date: 20160211
Lead Channel Impedance Value: 380 Ohm
Lead Channel Impedance Value: 460 Ohm
Lead Channel Pacing Threshold Amplitude: 0.75 V
Lead Channel Pacing Threshold Amplitude: 0.75 V
Lead Channel Pacing Threshold Pulse Width: 0.5 ms
Lead Channel Pacing Threshold Pulse Width: 0.5 ms
Lead Channel Sensing Intrinsic Amplitude: 3 mV
Lead Channel Sensing Intrinsic Amplitude: 9.5 mV
Lead Channel Setting Pacing Amplitude: 2 V
Lead Channel Setting Pacing Amplitude: 2.5 V
Lead Channel Setting Pacing Pulse Width: 0.5 ms
Lead Channel Setting Sensing Sensitivity: 0.5 mV
Pulse Gen Serial Number: 7230141

## 2022-11-10 NOTE — Progress Notes (Signed)
Remote ICD transmission.   

## 2022-11-14 NOTE — Progress Notes (Signed)
  Electrophysiology Office Note:   ID:  James Fowler, DOB 1970-06-27, MRN [id number]  Primary Cardiologist: None Electrophysiologist: Lewayne Bunting, MD      History of Present Illness:   James Fowler is a 52 y.o. male with h/o VT, s/p ICD, ARVD, and HLD seen today for routine electrophysiology followup.   Since last being seen in our clinic the patient reports doing very well.  he denies chest pain, palpitations, dyspnea, PND, orthopnea, nausea, vomiting, dizziness, syncope, edema, weight gain, or early satiety.   Review of systems complete and found to be negative unless listed in HPI.   Device History: Abbott Dual Chamber ICD implanted 2016 for ARVD/VT History of AAD therapy: Yes; currently on sotalol     Studies Reviewed:    ICD Interrogation-  reviewed in detail today,  See PACEART report.  EKG is ordered today. Personal review as below.  EKG Interpretation  Date/Time:  Tuesday November 16 2022 08:03:31 EDT Ventricular Rate:  47 PR Interval:  180 QRS Duration: 100 QT Interval:  460 QTC Calculation: 407 R Axis:   17 Text Interpretation: Sinus bradycardia Nonspecific ST and T wave abnormality QT is stable compared to 09/23/22 Confirmed by Maxine Glenn [id number]) on 11/16/2022 8:10:15 AM    Physical Exam:   VS:  BP 124/70   Pulse (!) 47   Ht 5\' 10"  (1.778 m)   Wt 198 lb 3.2 oz (89.9 kg)   SpO2 98%   BMI 28.44 kg/m    Wt Readings from Last 3 Encounters:  11/16/22 198 lb 3.2 oz (89.9 kg)  11/09/21 200 lb (90.7 kg)  10/14/20 203 lb 6.4 oz (92.3 kg)     GEN: Well nourished, well developed in no acute distress NECK: No JVD; No carotid bruits CARDIAC: Slow, regular rate and rhythm, no murmurs, rubs, gallops RESPIRATORY:  Clear to auscultation without rales, wheezing or rhonchi  ABDOMEN: Soft, non-tender, non-distended EXTREMITIES:  No edema; No deformity   ASSESSMENT AND PLAN:    ARVD s/p Abbott dual chamber ICD  euvolemic today Stable on an appropriate medical  regimen Normal ICD function See Pace Art report No changes today  H/o VT Continue sotalol 120 mg TID EKG today shows sinus bradycardia with stable QT interval  Disposition:   Follow up with Dr. Ladona Ridgel in 6 months   Signed, Graciella Freer, PA-C

## 2022-11-16 ENCOUNTER — Encounter: Payer: Self-pay | Admitting: Student

## 2022-11-16 ENCOUNTER — Ambulatory Visit: Payer: Medicaid Other | Attending: Student | Admitting: Student

## 2022-11-16 VITALS — BP 124/70 | HR 47 | Ht 70.0 in | Wt 198.2 lb

## 2022-11-16 DIAGNOSIS — Z95810 Presence of automatic (implantable) cardiac defibrillator: Secondary | ICD-10-CM

## 2022-11-16 DIAGNOSIS — I429 Cardiomyopathy, unspecified: Secondary | ICD-10-CM | POA: Diagnosis not present

## 2022-11-16 DIAGNOSIS — I472 Ventricular tachycardia, unspecified: Secondary | ICD-10-CM

## 2022-11-16 LAB — CUP PACEART INCLINIC DEVICE CHECK
Battery Remaining Longevity: 21 mo
Brady Statistic RA Percent Paced: 2.9 %
Brady Statistic RV Percent Paced: 0.12 %
Date Time Interrogation Session: 20240625084230
HighPow Impedance: 72 Ohm
Implantable Lead Connection Status: 753985
Implantable Lead Connection Status: 753985
Implantable Lead Implant Date: 20160211
Implantable Lead Implant Date: 20160211
Implantable Lead Location: 753859
Implantable Lead Location: 753860
Implantable Lead Model: 7122
Implantable Pulse Generator Implant Date: 20160211
Lead Channel Impedance Value: 375 Ohm
Lead Channel Impedance Value: 525 Ohm
Lead Channel Pacing Threshold Amplitude: 0.75 V
Lead Channel Pacing Threshold Amplitude: 0.75 V
Lead Channel Pacing Threshold Amplitude: 0.75 V
Lead Channel Pacing Threshold Amplitude: 0.75 V
Lead Channel Pacing Threshold Pulse Width: 0.5 ms
Lead Channel Pacing Threshold Pulse Width: 0.5 ms
Lead Channel Pacing Threshold Pulse Width: 0.5 ms
Lead Channel Pacing Threshold Pulse Width: 0.5 ms
Lead Channel Sensing Intrinsic Amplitude: 10.3 mV
Lead Channel Sensing Intrinsic Amplitude: 3.8 mV
Lead Channel Setting Pacing Amplitude: 2 V
Lead Channel Setting Pacing Amplitude: 2.5 V
Lead Channel Setting Pacing Pulse Width: 0.5 ms
Lead Channel Setting Sensing Sensitivity: 0.5 mV
Pulse Gen Serial Number: 7230141

## 2022-11-16 NOTE — Patient Instructions (Signed)
Medication Instructions:  Your physician recommends that you continue on your current medications as directed. Please refer to the Current Medication list given to you today.  *If you need a refill on your cardiac medications before your next appointment, please call your pharmacy*  Lab Work: BMET, MAG--TODAY If you have labs (blood work) drawn today and your tests are completely normal, you will receive your results only by: MyChart Message (if you have MyChart) OR A paper copy in the mail If you have any lab test that is abnormal or we need to change your treatment, we will call you to review the results.  Follow-Up: At [facility], you and your health needs are our priority.  As part of our continuing mission to provide you with exceptional heart care, we have created designated Provider Care Teams.  These Care Teams include your primary Cardiologist (physician) and Advanced Practice Providers (APPs -  Physician Assistants and Nurse Practitioners) who all work together to provide you with the care you need, when you need it.  Your next appointment:   6 month(s)  Provider:   Gregg Taylor, MD  

## 2022-11-17 LAB — BASIC METABOLIC PANEL
BUN/Creatinine Ratio: 18 (ref 9–20)
BUN: 17 mg/dL (ref 6–24)
CO2: 24 mmol/L (ref 20–29)
Calcium: 10.5 mg/dL — ABNORMAL HIGH (ref 8.7–10.2)
Chloride: 103 mmol/L (ref 96–106)
Creatinine, Ser: 0.96 mg/dL (ref 0.76–1.27)
Glucose: 90 mg/dL (ref 70–99)
Potassium: 4.5 mmol/L (ref 3.5–5.2)
Sodium: 139 mmol/L (ref 134–144)
eGFR: 96 mL/min/{1.73_m2} (ref >59)

## 2022-11-17 LAB — MAGNESIUM: Magnesium: 2.3 mg/dL (ref 1.6–2.3)

## 2022-12-07 ENCOUNTER — Other Ambulatory Visit: Payer: Self-pay | Admitting: *Deleted

## 2022-12-07 MED ORDER — SOTALOL HCL (AF) 120 MG PO TABS: 120.0000 mg | ORAL_TABLET | Freq: Three times a day (TID) | ORAL | 3 refills | Status: DC

## 2023-01-20 ENCOUNTER — Ambulatory Visit (INDEPENDENT_AMBULATORY_CARE_PROVIDER_SITE_OTHER): Payer: Medicaid Other

## 2023-01-20 DIAGNOSIS — I429 Cardiomyopathy, unspecified: Secondary | ICD-10-CM | POA: Diagnosis not present

## 2023-01-20 LAB — CUP PACEART REMOTE DEVICE CHECK
Battery Remaining Longevity: 16 mo
Battery Remaining Percentage: 17 %
Battery Voltage: 2.75 V
Brady Statistic AP VP Percent: 1 %
Brady Statistic AP VS Percent: 4.8 %
Brady Statistic AS VP Percent: 1 %
Brady Statistic AS VS Percent: 94 %
Brady Statistic RA Percent Paced: 4.2 %
Brady Statistic RV Percent Paced: 1 %
Date Time Interrogation Session: 20240829043042
HighPow Impedance: 72 Ohm
HighPow Impedance: 72 Ohm
Implantable Lead Connection Status: 753985
Implantable Lead Connection Status: 753985
Implantable Lead Implant Date: 20160211
Implantable Lead Implant Date: 20160211
Implantable Lead Location: 753859
Implantable Lead Location: 753860
Implantable Lead Model: 7122
Implantable Pulse Generator Implant Date: 20160211
Lead Channel Impedance Value: 380 Ohm
Lead Channel Impedance Value: 450 Ohm
Lead Channel Pacing Threshold Amplitude: 0.75 V
Lead Channel Pacing Threshold Amplitude: 0.75 V
Lead Channel Pacing Threshold Pulse Width: 0.5 ms
Lead Channel Pacing Threshold Pulse Width: 0.5 ms
Lead Channel Sensing Intrinsic Amplitude: 10.2 mV
Lead Channel Sensing Intrinsic Amplitude: 3.2 mV
Lead Channel Setting Pacing Amplitude: 2 V
Lead Channel Setting Pacing Amplitude: 2.5 V
Lead Channel Setting Pacing Pulse Width: 0.5 ms
Lead Channel Setting Sensing Sensitivity: 0.5 mV
Pulse Gen Serial Number: 7230141

## 2023-01-28 NOTE — Progress Notes (Signed)
Remote ICD transmission.   

## 2023-04-22 ENCOUNTER — Ambulatory Visit (INDEPENDENT_AMBULATORY_CARE_PROVIDER_SITE_OTHER): Payer: Medicaid Other

## 2023-04-22 DIAGNOSIS — I429 Cardiomyopathy, unspecified: Secondary | ICD-10-CM | POA: Diagnosis not present

## 2023-04-25 LAB — CUP PACEART REMOTE DEVICE CHECK
Battery Remaining Longevity: 12 mo
Battery Remaining Percentage: 13 %
Battery Voltage: 2.71 V
Brady Statistic AP VP Percent: 1 %
Brady Statistic AP VS Percent: 4.9 %
Brady Statistic AS VP Percent: 1 %
Brady Statistic AS VS Percent: 94 %
Brady Statistic RA Percent Paced: 4.4 %
Brady Statistic RV Percent Paced: 1 %
Date Time Interrogation Session: 20241128040007
HighPow Impedance: 73 Ohm
HighPow Impedance: 73 Ohm
Implantable Lead Connection Status: 753985
Implantable Lead Connection Status: 753985
Implantable Lead Implant Date: 20160211
Implantable Lead Implant Date: 20160211
Implantable Lead Location: 753859
Implantable Lead Location: 753860
Implantable Lead Model: 7122
Implantable Pulse Generator Implant Date: 20160211
Lead Channel Impedance Value: 360 Ohm
Lead Channel Impedance Value: 450 Ohm
Lead Channel Pacing Threshold Amplitude: 0.75 V
Lead Channel Pacing Threshold Amplitude: 0.75 V
Lead Channel Pacing Threshold Pulse Width: 0.5 ms
Lead Channel Pacing Threshold Pulse Width: 0.5 ms
Lead Channel Sensing Intrinsic Amplitude: 3.3 mV
Lead Channel Sensing Intrinsic Amplitude: 9.7 mV
Lead Channel Setting Pacing Amplitude: 2 V
Lead Channel Setting Pacing Amplitude: 2.5 V
Lead Channel Setting Pacing Pulse Width: 0.5 ms
Lead Channel Setting Sensing Sensitivity: 0.5 mV
Pulse Gen Serial Number: 7230141

## 2023-05-02 ENCOUNTER — Encounter: Payer: Self-pay | Admitting: Internal Medicine

## 2023-05-02 ENCOUNTER — Ambulatory Visit: Payer: BC Managed Care – PPO | Attending: Internal Medicine | Admitting: Internal Medicine

## 2023-05-02 VITALS — BP 108/62 | HR 43 | Ht 70.0 in | Wt 207.0 lb

## 2023-05-02 DIAGNOSIS — Z95810 Presence of automatic (implantable) cardiac defibrillator: Secondary | ICD-10-CM

## 2023-05-02 LAB — CUP PACEART INCLINIC DEVICE CHECK
Date Time Interrogation Session: 20241209112716
Implantable Lead Connection Status: 753985
Implantable Lead Connection Status: 753985
Implantable Lead Implant Date: 20160211
Implantable Lead Implant Date: 20160211
Implantable Lead Location: 753859
Implantable Lead Location: 753860
Implantable Lead Model: 7122
Implantable Pulse Generator Implant Date: 20160211
Pulse Gen Serial Number: 7230141

## 2023-05-02 NOTE — Progress Notes (Signed)
HPI  Mr. Stjulian returns today for followup of his ICD and VT. He is a pleasant 52 yo man with ARVD, VT and dyslipidemia. He continues to have more VT in the interim and increased his sotalol to 120 tid. He could not tolerate the 180 bid previously tried but is toleratin 120 tid. He denies chest pain or sob. No syncope. He is working out, swimming and activities are unlimited.  No edema. He has had no VT.       Past Medical History:  Diagnosis Date   Arrhythmogenic right ventricular dysplasia (HCC)    a. 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   VT (ventricular tachycardia) (HCC)    a. 06/2014 ARVD s/p SJM dc AICD.-Dr. Rosette Reveal follows    ROS:   All systems reviewed and negative except as noted in the HPI.   Past Surgical History:  Procedure Laterality Date   arm surgery  1991   2 steel plates in left arm   ELECTROPHYSIOLOGY STUDY N/A 07/03/2014   EPS by Dr Graciela Husbands with inducible VT   IMPLANTABLE CARDIOVERTER DEFIBRILLATOR IMPLANT N/A 07/04/2014   STJ dual chamber ICD implanted by Dr Ladona Ridgel for secondary prevention   LEFT HEART CATHETERIZATION WITH CORONARY ANGIOGRAM N/A 07/02/2014   no CAD, normal LV function   TOTAL HIP ARTHROPLASTY Right 07/08/2015   Procedure: RIGHT TOTAL HIP ARTHROPLASTY ANTERIOR APPROACH;  Surgeon: Durene Romans, MD;  Location: [facility];  Service: Orthopedics;  Laterality: Right;   VASECTOMY       Family History  Problem Relation Age of Onset   Alcohol abuse Mother    Mental illness Father        committed suicide     Social History   Socioeconomic History   Marital status: Married    Spouse name: Not on file   Number of children: Not on file   Years of education: Not on file   Highest education level: Not on file  Occupational History   Not on file  Tobacco Use   Smoking  status: Former    Current packs/day: 0.00    Types: Cigarettes    Start date: 06/23/2008    Quit date: 06/23/2013    Years since quitting: 9.8   Smokeless tobacco: Never  Vaping Use   Vaping status: Never Used  Substance and Sexual Activity   Alcohol use: No   Drug use: No   Sexual activity: Yes  Other Topics Concern   Not on file  Social History Narrative   Not on file   Social Determinants of Health   Financial Resource Strain: Low Risk  (01/18/2023)   Received from [facility]   Overall Financial Resource Strain (CARDIA)    Difficulty of Paying Living Expenses: Not hard at all  Food Insecurity: No Food Insecurity (01/18/2023)   Received from [facility]   Hunger Vital Sign    Worried About Running Out of Food in the Last Year: Never true    Ran Out of Food in the Last Year: Never true  Transportation Needs: No Transportation Needs (01/18/2023)   Received from [facility] - Transportation    Lack of Transportation (Medical): No    Lack of Transportation (Non-Medical): No  Physical Activity: Unknown (01/18/2023)   Received from [facility]   Exercise  Vital Sign    Days of Exercise per Week: 0 days    Minutes of Exercise per Session: Not on file  Stress: No Stress Concern Present (01/18/2023)   Received from [facility] - Occupational Stress Questionnaire    Feeling of Stress : Not at all  Social Connections: Moderately Integrated (01/18/2023)   Received from [facility]   Social Network    How would you rate your social network (family, work, friends)?: Adequate participation with social networks  Intimate Partner Violence: Not At Risk (01/18/2023)   Received from [facility]   HITS    Over the last 12 months how often did your partner physically hurt you?: Never    Over the last 12 months how often did your partner insult you or talk down to you?: Never    Over the last 12 months how often did your partner  threaten you with physical harm?: Never    Over the last 12 months how often did your partner scream or curse at you?: Never     BP 108/62   Pulse (!) 43   Ht 5\' 10"  (1.778 m)   Wt 207 lb (93.9 kg)   SpO2 98%   BMI 29.70 kg/m   Physical Exam:  Well appearing NAD HEENT: Unremarkable Neck:  No JVD, no thyromegally Lymphatics:  No adenopathy Back:  No CVA tenderness Lungs:  Clear with no wheezes HEART:  Regular rate rhythm, no murmurs, no rubs, no clicks Abd:  soft, positive bowel sounds, no organomegally, no rebound, no guarding Ext:  2 plus pulses, no edema, no cyanosis, no clubbing Skin:  No rashes no nodules Neuro:  CN II through XII intact, motor grossly intact  EKG - sinus bradycardia  DEVICE  Normal device function.  See PaceArt for details.   Assess/Plan: VT/ARVC - his ventricular arrhythmias have been well controlled on sotalol. Continue. He is bradycardic but asymptomaitc. Sinus node dysfunction - for now he will undergo watchful waiting.  Dyslipidemia - continue lipitor.  Sharlot Gowda Amaia Lavallie,MD

## 2023-05-02 NOTE — Patient Instructions (Signed)

## 2023-05-16 NOTE — Addendum Note (Signed)
Addended by: Elease Etienne A on: 05/16/2023 02:55 PM   Modules accepted: Orders

## 2023-05-16 NOTE — Progress Notes (Signed)
Remote ICD transmission.   

## 2023-05-27 ENCOUNTER — Encounter: Payer: Self-pay | Admitting: Cardiology

## 2023-07-21 ENCOUNTER — Ambulatory Visit (INDEPENDENT_AMBULATORY_CARE_PROVIDER_SITE_OTHER): Payer: Self-pay

## 2023-07-21 DIAGNOSIS — I429 Cardiomyopathy, unspecified: Secondary | ICD-10-CM | POA: Diagnosis not present

## 2023-07-26 ENCOUNTER — Ambulatory Visit (INDEPENDENT_AMBULATORY_CARE_PROVIDER_SITE_OTHER): Payer: Self-pay

## 2023-07-26 DIAGNOSIS — I429 Cardiomyopathy, unspecified: Secondary | ICD-10-CM

## 2023-07-26 DIAGNOSIS — I472 Ventricular tachycardia, unspecified: Secondary | ICD-10-CM

## 2023-07-27 LAB — CUP PACEART REMOTE DEVICE CHECK
Battery Remaining Longevity: 9 mo
Battery Remaining Percentage: 9 %
Battery Voltage: 2.68 V
Brady Statistic AP VP Percent: 1 %
Brady Statistic AP VS Percent: 3.8 %
Brady Statistic AS VP Percent: 1 %
Brady Statistic AS VS Percent: 96 %
Brady Statistic RA Percent Paced: 3.3 %
Brady Statistic RV Percent Paced: 1 %
Date Time Interrogation Session: 20250227020016
HighPow Impedance: 78 Ohm
HighPow Impedance: 78 Ohm
Lead Channel Impedance Value: 380 Ohm
Lead Channel Impedance Value: 510 Ohm
Lead Channel Pacing Threshold Amplitude: 0.75 V
Lead Channel Pacing Threshold Amplitude: 1 V
Lead Channel Pacing Threshold Pulse Width: 0.5 ms
Lead Channel Pacing Threshold Pulse Width: 0.5 ms
Lead Channel Sensing Intrinsic Amplitude: 3.3 mV
Lead Channel Sensing Intrinsic Amplitude: 9.5 mV
Lead Channel Setting Pacing Amplitude: 2 V
Lead Channel Setting Pacing Amplitude: 2.5 V
Lead Channel Setting Pacing Pulse Width: 0.5 ms
Lead Channel Setting Sensing Sensitivity: 0.5 mV
Pulse Gen Serial Number: 7230141

## 2023-07-28 ENCOUNTER — Encounter: Payer: Self-pay | Admitting: Internal Medicine

## 2023-08-23 NOTE — Progress Notes (Signed)
 Remote ICD transmission.

## 2023-08-31 NOTE — Progress Notes (Signed)
 Remote ICD transmission.

## 2023-08-31 NOTE — Addendum Note (Signed)
 Addended by: Geralyn Flash D on: 08/31/2023 01:09 PM   Modules accepted: Orders

## 2023-09-04 ENCOUNTER — Other Ambulatory Visit: Payer: Self-pay | Admitting: Internal Medicine

## 2023-10-25 ENCOUNTER — Ambulatory Visit (INDEPENDENT_AMBULATORY_CARE_PROVIDER_SITE_OTHER): Payer: Self-pay

## 2023-10-25 DIAGNOSIS — I429 Cardiomyopathy, unspecified: Secondary | ICD-10-CM

## 2023-10-25 LAB — CUP PACEART REMOTE DEVICE CHECK
Battery Remaining Longevity: 8 mo
Battery Remaining Percentage: 8 %
Battery Voltage: 2.65 V
Brady Statistic AP VP Percent: 1 %
Brady Statistic AP VS Percent: 6.3 %
Brady Statistic AS VP Percent: 1 %
Brady Statistic AS VS Percent: 93 %
Brady Statistic RA Percent Paced: 5.7 %
Brady Statistic RV Percent Paced: 1 %
Date Time Interrogation Session: 20250603020025
HighPow Impedance: 71 Ohm
HighPow Impedance: 71 Ohm
Lead Channel Impedance Value: 360 Ohm
Lead Channel Impedance Value: 450 Ohm
Lead Channel Pacing Threshold Amplitude: 0.75 V
Lead Channel Pacing Threshold Amplitude: 1 V
Lead Channel Pacing Threshold Pulse Width: 0.5 ms
Lead Channel Pacing Threshold Pulse Width: 0.5 ms
Lead Channel Sensing Intrinsic Amplitude: 2.8 mV
Lead Channel Sensing Intrinsic Amplitude: 9.3 mV
Lead Channel Setting Pacing Amplitude: 2 V
Lead Channel Setting Pacing Amplitude: 2.5 V
Lead Channel Setting Pacing Pulse Width: 0.5 ms
Lead Channel Setting Sensing Sensitivity: 0.5 mV
Pulse Gen Serial Number: 7230141

## 2023-10-27 ENCOUNTER — Ambulatory Visit: Payer: Self-pay | Admitting: Internal Medicine

## 2023-12-22 NOTE — Progress Notes (Signed)
 Remote ICD transmission.

## 2024-01-24 ENCOUNTER — Ambulatory Visit (INDEPENDENT_AMBULATORY_CARE_PROVIDER_SITE_OTHER): Payer: Self-pay

## 2024-01-24 DIAGNOSIS — I429 Cardiomyopathy, unspecified: Secondary | ICD-10-CM

## 2024-01-24 LAB — CUP PACEART REMOTE DEVICE CHECK
Battery Remaining Longevity: 4 mo
Battery Remaining Percentage: 4 %
Battery Voltage: 2.62 V
Brady Statistic AP VP Percent: 1 %
Brady Statistic AP VS Percent: 6.7 %
Brady Statistic AS VP Percent: 1 %
Brady Statistic AS VS Percent: 92 %
Brady Statistic RA Percent Paced: 6 %
Brady Statistic RV Percent Paced: 1 %
Date Time Interrogation Session: 20250902023150
HighPow Impedance: 72 Ohm
HighPow Impedance: 72 Ohm
Lead Channel Impedance Value: 350 Ohm
Lead Channel Impedance Value: 440 Ohm
Lead Channel Pacing Threshold Amplitude: 0.75 V
Lead Channel Pacing Threshold Amplitude: 1 V
Lead Channel Pacing Threshold Pulse Width: 0.5 ms
Lead Channel Pacing Threshold Pulse Width: 0.5 ms
Lead Channel Sensing Intrinsic Amplitude: 2.6 mV
Lead Channel Sensing Intrinsic Amplitude: 8.6 mV
Lead Channel Setting Pacing Amplitude: 2 V
Lead Channel Setting Pacing Amplitude: 2.5 V
Lead Channel Setting Pacing Pulse Width: 0.5 ms
Lead Channel Setting Sensing Sensitivity: 0.5 mV
Pulse Gen Serial Number: 7230141

## 2024-01-27 ENCOUNTER — Ambulatory Visit: Payer: Self-pay | Admitting: Internal Medicine

## 2024-01-31 NOTE — Progress Notes (Signed)
Remote ICD Transmission.

## 2024-02-27 ENCOUNTER — Ambulatory Visit

## 2024-02-29 LAB — CUP PACEART REMOTE DEVICE CHECK
Battery Remaining Longevity: 5 mo
Battery Remaining Percentage: 5 %
Battery Voltage: 2.62 V
Brady Statistic AP VP Percent: 1 %
Brady Statistic AP VS Percent: 6.8 %
Brady Statistic AS VP Percent: 1 %
Brady Statistic AS VS Percent: 92 %
Brady Statistic RA Percent Paced: 6.1 %
Brady Statistic RV Percent Paced: 1 %
Date Time Interrogation Session: 20251006020017
HighPow Impedance: 81 Ohm
HighPow Impedance: 81 Ohm
Lead Channel Impedance Value: 430 Ohm
Lead Channel Impedance Value: 480 Ohm
Lead Channel Pacing Threshold Amplitude: 0.75 V
Lead Channel Pacing Threshold Amplitude: 1 V
Lead Channel Pacing Threshold Pulse Width: 0.5 ms
Lead Channel Pacing Threshold Pulse Width: 0.5 ms
Lead Channel Sensing Intrinsic Amplitude: 3.5 mV
Lead Channel Sensing Intrinsic Amplitude: 9.9 mV
Lead Channel Setting Pacing Amplitude: 2 V
Lead Channel Setting Pacing Amplitude: 2.5 V
Lead Channel Setting Pacing Pulse Width: 0.5 ms
Lead Channel Setting Sensing Sensitivity: 0.5 mV
Pulse Gen Serial Number: 7230141

## 2024-03-01 ENCOUNTER — Ambulatory Visit: Payer: Self-pay | Admitting: Internal Medicine

## 2024-03-29 ENCOUNTER — Ambulatory Visit

## 2024-03-30 LAB — CUP PACEART REMOTE DEVICE CHECK
Battery Remaining Longevity: 3 mo
Battery Remaining Percentage: 3 %
Battery Voltage: 2.6 V
Brady Statistic AP VP Percent: 1 %
Brady Statistic AP VS Percent: 6.5 %
Brady Statistic AS VP Percent: 1 %
Brady Statistic AS VS Percent: 92 %
Brady Statistic RA Percent Paced: 5.8 %
Brady Statistic RV Percent Paced: 1 %
Date Time Interrogation Session: 20251106195147
HighPow Impedance: 68 Ohm
HighPow Impedance: 68 Ohm
Lead Channel Impedance Value: 360 Ohm
Lead Channel Impedance Value: 440 Ohm
Lead Channel Pacing Threshold Amplitude: 0.75 V
Lead Channel Pacing Threshold Amplitude: 1 V
Lead Channel Pacing Threshold Pulse Width: 0.5 ms
Lead Channel Pacing Threshold Pulse Width: 0.5 ms
Lead Channel Sensing Intrinsic Amplitude: 2.9 mV
Lead Channel Sensing Intrinsic Amplitude: 8.9 mV
Lead Channel Setting Pacing Amplitude: 2 V
Lead Channel Setting Pacing Amplitude: 2.5 V
Lead Channel Setting Pacing Pulse Width: 0.5 ms
Lead Channel Setting Sensing Sensitivity: 0.5 mV
Pulse Gen Serial Number: 7230141

## 2024-04-02 ENCOUNTER — Ambulatory Visit: Payer: Self-pay | Admitting: Internal Medicine

## 2024-04-26 ENCOUNTER — Ambulatory Visit: Attending: Internal Medicine | Admitting: Internal Medicine

## 2024-04-29 ENCOUNTER — Ambulatory Visit: Attending: Internal Medicine

## 2024-04-30 ENCOUNTER — Encounter

## 2024-04-30 LAB — CUP PACEART REMOTE DEVICE CHECK
Battery Remaining Longevity: 3 mo
Battery Remaining Percentage: 3 %
Battery Voltage: 2.6 V
Brady Statistic AP VP Percent: 1 %
Brady Statistic AP VS Percent: 6.1 %
Brady Statistic AS VP Percent: 1 %
Brady Statistic AS VS Percent: 92 %
Brady Statistic RA Percent Paced: 5.4 %
Brady Statistic RV Percent Paced: 1 %
Date Time Interrogation Session: 20251208060142
HighPow Impedance: 75 Ohm
HighPow Impedance: 75 Ohm
Lead Channel Impedance Value: 360 Ohm
Lead Channel Impedance Value: 450 Ohm
Lead Channel Pacing Threshold Amplitude: 0.75 V
Lead Channel Pacing Threshold Amplitude: 1 V
Lead Channel Pacing Threshold Pulse Width: 0.5 ms
Lead Channel Pacing Threshold Pulse Width: 0.5 ms
Lead Channel Sensing Intrinsic Amplitude: 3.2 mV
Lead Channel Sensing Intrinsic Amplitude: 9.6 mV
Lead Channel Setting Pacing Amplitude: 2 V
Lead Channel Setting Pacing Amplitude: 2.5 V
Lead Channel Setting Pacing Pulse Width: 0.5 ms
Lead Channel Setting Sensing Sensitivity: 0.5 mV
Pulse Gen Serial Number: 7230141

## 2024-05-02 ENCOUNTER — Ambulatory Visit: Payer: Self-pay | Admitting: Internal Medicine

## 2024-05-08 ENCOUNTER — Encounter: Payer: Self-pay | Admitting: Internal Medicine

## 2024-05-08 NOTE — Progress Notes (Signed)
 Remote ICD Transmission

## 2024-05-30 ENCOUNTER — Ambulatory Visit

## 2024-05-31 ENCOUNTER — Encounter

## 2024-06-07 ENCOUNTER — Ambulatory Visit: Attending: Student in an Organized Health Care Education/Training Program

## 2024-06-08 LAB — CUP PACEART REMOTE DEVICE CHECK
Battery Remaining Longevity: 0 mo
Battery Voltage: 2.59 V
Brady Statistic AP VP Percent: 1 %
Brady Statistic AP VS Percent: 5.9 %
Brady Statistic AS VP Percent: 1 %
Brady Statistic AS VS Percent: 93 %
Brady Statistic RA Percent Paced: 5.2 %
Brady Statistic RV Percent Paced: 1 %
Date Time Interrogation Session: 20260115195229
HighPow Impedance: 72 Ohm
HighPow Impedance: 72 Ohm
Lead Channel Impedance Value: 360 Ohm
Lead Channel Impedance Value: 450 Ohm
Lead Channel Pacing Threshold Amplitude: 0.75 V
Lead Channel Pacing Threshold Amplitude: 1 V
Lead Channel Pacing Threshold Pulse Width: 0.5 ms
Lead Channel Pacing Threshold Pulse Width: 0.5 ms
Lead Channel Sensing Intrinsic Amplitude: 2.5 mV
Lead Channel Sensing Intrinsic Amplitude: 8.1 mV
Lead Channel Setting Pacing Amplitude: 2 V
Lead Channel Setting Pacing Amplitude: 2.5 V
Lead Channel Setting Pacing Pulse Width: 0.5 ms
Lead Channel Setting Sensing Sensitivity: 0.5 mV
Pulse Gen Serial Number: 7230141

## 2024-06-10 ENCOUNTER — Ambulatory Visit: Payer: Self-pay | Admitting: Student in an Organized Health Care Education/Training Program

## 2024-06-10 NOTE — H&P (View-Only) (Signed)
" °  Electrophysiology Office Note:   Date:  06/12/2024  ID:  James Fowler, DOB 10/23/1970, MRN [id number]  Primary Cardiologist: None Primary Heart Failure: None Electrophysiologist: Donnice DELENA Primus, MD       History of Present Illness:   James Fowler is a 54 y.o. 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   At [facility] as of 05/08/24.   Since last being seen in our clinic the patient reports doing well physically. He is going through a divorce. No issues with sotalol .  He is disabled but works at [employer] to keep busy.   He denies chest pain, palpitations, dyspnea, PND, orthopnea, nausea, vomiting, dizziness, syncope, edema, weight gain, or early satiety.   Review of systems complete and found to be negative unless listed in HPI.    EP Information / Studies Reviewed:    EKG is ordered today. Personal review as below.  EKG Interpretation Date/Time:  Tuesday June 12 2024 08:21:33 EST Ventricular Rate:  49 PR Interval:  176 QRS Duration:  94 QT Interval:  472 QTC Calculation: 426 R Axis:   48  Text Interpretation: Sinus bradycardia Confirmed by Aniceto Jarvis ([id number]) on 06/12/2024 8:34:54 AM   ICD Interrogation-  reviewed in detail today,  See PACEART report.  Device History: Abbott Dual Chamber ICD implanted 07/04/14 for VT History of appropriate therapy: Yes in 2016 History of AAD therapy: Yes; currently on Sotalol             Physical Exam:   VS:  BP 114/82   Pulse (!) 49   Ht 5' 10 (1.778 m)   Wt 200 lb 9.6 oz (91 kg)   SpO2 97%   BMI 28.78 kg/m    Wt Readings from Last 3 Encounters:  06/12/24 200 lb 9.6 oz (91 kg)  05/02/23 207 lb (93.9 kg)  11/16/22 198 lb 3.2 oz (89.9 kg)     GEN: Well nourished, well developed in no acute distress NECK: No JVD; No carotid bruits CARDIAC: Regular rate and rhythm, no murmurs, rubs, gallops,  RESPIRATORY:  Clear to auscultation without rales, wheezing or rhonchi   ABDOMEN: Soft, non-tender, non-distended EXTREMITIES:  No edema; No deformity   ASSESSMENT AND PLAN:    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, plan for gen change on 06/21/24 with Dr. Primus  -BMP, CBC as pre-procedure labs   Disposition:   Follow up with Dr. Primus as scheduled for generator change    Signed, Jarvis Aniceto, NP-C, AGACNP-BC [facility] - Electrophysiology  06/12/2024, 8:53 AM  "

## 2024-06-10 NOTE — Progress Notes (Unsigned)
" °  Electrophysiology Office Note:   Date:  06/12/2024  ID:  James Fowler, DOB 1970-10-29, MRN [id number]  Primary Cardiologist: None Primary Heart Failure: None Electrophysiologist: Donnice DELENA Primus, MD       History of Present Illness:   James Fowler is a 54 y.o. 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   At [facility] as of 05/08/24.   Since last being seen in our clinic the patient reports doing well physically. He is going through a divorce. No issues with sotalol .  He is disabled but works at [employer] to keep busy.   He denies chest pain, palpitations, dyspnea, PND, orthopnea, nausea, vomiting, dizziness, syncope, edema, weight gain, or early satiety.   Review of systems complete and found to be negative unless listed in HPI.    EP Information / Studies Reviewed:    EKG is ordered today. Personal review as below.  EKG Interpretation Date/Time:  Tuesday June 12 2024 08:21:33 EST Ventricular Rate:  49 PR Interval:  176 QRS Duration:  94 QT Interval:  472 QTC Calculation: 426 R Axis:   48  Text Interpretation: Sinus bradycardia Confirmed by Aniceto Jarvis ([id number]) on 06/12/2024 8:34:54 AM   ICD Interrogation-  reviewed in detail today,  See PACEART report.  Device History: Abbott Dual Chamber ICD implanted 07/04/14 for VT History of appropriate therapy: Yes in 2016 History of AAD therapy: Yes; currently on Sotalol             Physical Exam:   VS:  BP 114/82   Pulse (!) 49   Ht 5' 10 (1.778 m)   Wt 200 lb 9.6 oz (91 kg)   SpO2 97%   BMI 28.78 kg/m    Wt Readings from Last 3 Encounters:  06/12/24 200 lb 9.6 oz (91 kg)  05/02/23 207 lb (93.9 kg)  11/16/22 198 lb 3.2 oz (89.9 kg)     GEN: Well nourished, well developed in no acute distress NECK: No JVD; No carotid bruits CARDIAC: Regular rate and rhythm, no murmurs, rubs, gallops,  RESPIRATORY:  Clear to auscultation without rales, wheezing or rhonchi   ABDOMEN: Soft, non-tender, non-distended EXTREMITIES:  No edema; No deformity   ASSESSMENT AND PLAN:    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 Primus  -BMP, CBC as pre-procedure labs   Disposition:   Follow up with Dr. Primus as scheduled for generator change    Signed, Jarvis Aniceto, NP-C, AGACNP-BC [facility] - Electrophysiology  06/12/2024, 8:53 AM  "

## 2024-06-12 ENCOUNTER — Encounter: Payer: Self-pay | Admitting: Pulmonary Disease

## 2024-06-12 ENCOUNTER — Ambulatory Visit: Admitting: Pulmonary Disease

## 2024-06-12 VITALS — BP 114/82 | HR 49 | Ht 70.0 in | Wt 200.6 lb

## 2024-06-12 DIAGNOSIS — I429 Cardiomyopathy, unspecified: Secondary | ICD-10-CM

## 2024-06-12 DIAGNOSIS — I472 Ventricular tachycardia, unspecified: Secondary | ICD-10-CM | POA: Diagnosis not present

## 2024-06-12 DIAGNOSIS — Z95810 Presence of automatic (implantable) cardiac defibrillator: Secondary | ICD-10-CM

## 2024-06-12 LAB — CUP PACEART INCLINIC DEVICE CHECK
Battery Remaining Longevity: 0 mo
Brady Statistic RA Percent Paced: 5.3 %
Brady Statistic RV Percent Paced: 0.14 %
Date Time Interrogation Session: 20260120085734
HighPow Impedance: 77.625
Implantable Lead Connection Status: 753985
Implantable Lead Connection Status: 753985
Implantable Lead Implant Date: 20160211
Implantable Lead Implant Date: 20160211
Implantable Lead Location: 753859
Implantable Lead Location: 753860
Implantable Lead Model: 7122
Implantable Pulse Generator Implant Date: 20160211
Lead Channel Impedance Value: 387.5 Ohm
Lead Channel Impedance Value: 475 Ohm
Lead Channel Pacing Threshold Amplitude: 0.75 V
Lead Channel Pacing Threshold Amplitude: 0.75 V
Lead Channel Pacing Threshold Amplitude: 0.75 V
Lead Channel Pacing Threshold Amplitude: 0.75 V
Lead Channel Pacing Threshold Pulse Width: 0.5 ms
Lead Channel Pacing Threshold Pulse Width: 0.5 ms
Lead Channel Pacing Threshold Pulse Width: 0.5 ms
Lead Channel Pacing Threshold Pulse Width: 0.5 ms
Lead Channel Sensing Intrinsic Amplitude: 3.9 mV
Lead Channel Sensing Intrinsic Amplitude: 8.8 mV
Lead Channel Setting Pacing Amplitude: 2 V
Lead Channel Setting Pacing Amplitude: 2.5 V
Lead Channel Setting Pacing Pulse Width: 0.5 ms
Lead Channel Setting Sensing Sensitivity: 0.5 mV
Pulse Gen Serial Number: 7230141

## 2024-06-12 NOTE — Patient Instructions (Signed)
 Medication Instructions:  No medications changes today   *If you need a refill on your cardiac medications before your next appointment, please call your pharmacy*  Lab Work: We will get a BMP and CBC to review before your generator change.   If you have labs (blood work) drawn today and your tests are completely normal, you will receive your results only by: MyChart Message (if you have MyChart) OR A paper copy in the mail If you have any lab test that is abnormal or we need to change your treatment, we will call you to review the results.  Testing/Procedures: You are scheduled for battery change at [facility] on 06/21/24 with Dr. Almetta.  Please review your letter carefully for instructions.   Follow-Up: At [facility], you and your health needs are our priority.  As part of our continuing mission to provide you with exceptional heart care, our providers are all part of one team.  This team includes your primary Cardiologist (physician) and Advanced Practice Providers or APPs (Physician Assistants and Nurse Practitioners) who all work together to provide you with the care you need, when you need it.  Your next appointment:   We will see you after your battery change in the clinic.   Provider:   You may see Donnice DELENA Almetta, MD or one of the following Advanced Practice Providers on your designated Care Team:    Daphne Barrack, NP    We recommend signing up for the patient portal called MyChart.  Sign up information is provided on this After Visit Summary.  MyChart is used to connect with patients for Virtual Visits (Telemedicine).  Patients are able to view lab/test results, encounter notes, upcoming appointments, etc.  Non-urgent messages can be sent to your provider as well.   To learn more about what you can do with MyChart, go to [email or web address].

## 2024-06-13 ENCOUNTER — Ambulatory Visit: Payer: Self-pay | Admitting: Pulmonary Disease

## 2024-06-13 LAB — CBC
Hematocrit: 49.2 % (ref 37.5–51.0)
Hemoglobin: 16.4 g/dL (ref 13.0–17.7)
MCH: 31.1 pg (ref 26.6–33.0)
MCHC: 33.3 g/dL (ref 31.5–35.7)
MCV: 93 fL (ref 79–97)
Platelets: 228 x10E3/uL (ref 150–450)
RBC: 5.27 x10E6/uL (ref 4.14–5.80)
RDW: 14.1 % (ref 11.6–15.4)
WBC: 7 x10E3/uL (ref 3.4–10.8)

## 2024-06-13 LAB — BASIC METABOLIC PANEL WITH GFR
BUN/Creatinine Ratio: 11 (ref 9–20)
BUN: 11 mg/dL (ref 6–24)
CO2: 21 mmol/L (ref 20–29)
Calcium: 10.5 mg/dL — ABNORMAL HIGH (ref 8.7–10.2)
Chloride: 104 mmol/L (ref 96–106)
Creatinine, Ser: 1 mg/dL (ref 0.76–1.27)
Glucose: 90 mg/dL (ref 70–99)
Potassium: 5.1 mmol/L (ref 3.5–5.2)
Sodium: 140 mmol/L (ref 134–144)
eGFR: 90 mL/min/1.73

## 2024-06-20 NOTE — Pre-Procedure Instructions (Signed)
 Attempted to call patient regarding procedure instructions.  Left voicemail on the following items: Arrival time 0730- new arrival time Nothing to eat or drink after midnight No meds AM of procedure Responsible person to drive you home and stay with you for 24 hrs Wash with special soap night before and morning of procedure

## 2024-06-21 ENCOUNTER — Ambulatory Visit (HOSPITAL_COMMUNITY)
Admission: RE | Admit: 2024-06-21 | Discharge: 2024-06-21 | Disposition: A | Discharge status: Home / Self Care | Attending: Student in an Organized Health Care Education/Training Program | Admitting: Student in an Organized Health Care Education/Training Program

## 2024-06-21 ENCOUNTER — Other Ambulatory Visit: Payer: Self-pay

## 2024-06-21 ENCOUNTER — Encounter (HOSPITAL_COMMUNITY)
Admission: RE | Disposition: A | Payer: Self-pay | Source: Home / Self Care | Attending: Student in an Organized Health Care Education/Training Program

## 2024-06-21 DIAGNOSIS — Z79899 Other long term (current) drug therapy: Secondary | ICD-10-CM | POA: Insufficient documentation

## 2024-06-21 DIAGNOSIS — I472 Ventricular tachycardia, unspecified: Secondary | ICD-10-CM | POA: Diagnosis not present

## 2024-06-21 DIAGNOSIS — I429 Cardiomyopathy, unspecified: Secondary | ICD-10-CM | POA: Insufficient documentation

## 2024-06-21 DIAGNOSIS — Z4502 Encounter for adjustment and management of automatic implantable cardiac defibrillator: Secondary | ICD-10-CM | POA: Insufficient documentation

## 2024-06-21 DIAGNOSIS — Z635 Disruption of family by separation and divorce: Secondary | ICD-10-CM | POA: Insufficient documentation

## 2024-06-21 MED ORDER — SODIUM CHLORIDE 0.9 % IV SOLN
80.0000 mg | INTRAVENOUS | Status: AC
  Administered 2024-06-21: 80 mg

## 2024-06-21 MED ORDER — MIDAZOLAM HCL 2 MG/2ML IJ SOLN
INTRAMUSCULAR | Status: AC
  Filled 2024-06-21: qty 2

## 2024-06-21 MED ORDER — CEFAZOLIN SODIUM-DEXTROSE 2-4 GM/100ML-% IV SOLN
INTRAVENOUS | Status: AC
  Filled 2024-06-21: qty 100

## 2024-06-21 MED ORDER — POVIDONE-IODINE 10 % EX SWAB: 2.0000 | Freq: Once | CUTANEOUS | Status: DC

## 2024-06-21 MED ORDER — CEFAZOLIN SODIUM-DEXTROSE 2-4 GM/100ML-% IV SOLN
2.0000 g | INTRAVENOUS | Status: AC
  Administered 2024-06-21: 2 g via INTRAVENOUS

## 2024-06-21 MED ORDER — FENTANYL CITRATE (PF) 100 MCG/2ML IJ SOLN
INTRAMUSCULAR | Status: DC | PRN
  Administered 2024-06-21: 25 ug via INTRAVENOUS

## 2024-06-21 MED ORDER — MIDAZOLAM HCL 5 MG/5ML IJ SOLN
INTRAMUSCULAR | Status: DC | PRN
  Administered 2024-06-21: 1 mg via INTRAVENOUS

## 2024-06-21 MED ORDER — ONDANSETRON HCL 4 MG/2ML IJ SOLN: 4.0000 mg | Freq: Four times a day (QID) | INTRAMUSCULAR | Status: DC | PRN

## 2024-06-21 MED ORDER — SODIUM CHLORIDE 0.9 % IV SOLN
INTRAVENOUS | Status: AC
  Filled 2024-06-21: qty 2

## 2024-06-21 MED ORDER — SODIUM CHLORIDE 0.9 % IV SOLN: INTRAVENOUS | Status: DC

## 2024-06-21 MED ORDER — FENTANYL CITRATE (PF) 100 MCG/2ML IJ SOLN
INTRAMUSCULAR | Status: AC
  Filled 2024-06-21: qty 2

## 2024-06-21 MED ORDER — CHLORHEXIDINE GLUCONATE 4 % EX SOLN: 4.0000 | Freq: Once | CUTANEOUS | Status: DC

## 2024-06-21 MED ORDER — ACETAMINOPHEN 325 MG PO TABS: 325.0000 mg | ORAL_TABLET | ORAL | Status: DC | PRN

## 2024-06-21 MED ORDER — LIDOCAINE HCL (PF) 1 % IJ SOLN
INTRAMUSCULAR | Status: AC
  Filled 2024-06-21: qty 60

## 2024-06-21 NOTE — Discharge Instructions (Addendum)

## 2024-06-21 NOTE — Interval H&P Note (Signed)
 History and Physical Interval Note:  06/21/2024 9:59 AM  James Fowler  has presented today for surgery, with the diagnosis of eri.  The various methods of treatment have been discussed with the patient and family. After consideration of risks, benefits and other options for treatment, the patient has consented to  Procedures: ICD GENERATOR CHANGEOUT (N/A) as a surgical intervention.  The patient's history has been reviewed, patient examined, no change in status, stable for surgery.  I have reviewed the patient's chart and labs.  Questions were answered to the patient's satisfaction.     James Fowler

## 2024-06-21 NOTE — Op Note (Signed)
" ° °  Procedure:  LEFT sided Abbott DC-ICD generator change  Pre-op Diagnosis: VT, 2' prevention   Post-op Diagnosis: same  Procedure Date: 06/21/24  Attending: Adina Primus, MD   Anesthesia: monitored anesthesia care  Procedure Report:  The LEFT shoulder was prepped with chlorhexidine  scrub. 50 cc lidocaine  was injected at the planned incision site. The subcutaneous tissue was dissected with electrocautery and blunt dissection. The pocket overlying the device was dissected to allow for removal of the device. The leads were then removed from the implanted device.  The new device was then connected to the existing leads. The pocket was irrigated with gentamicin . A TYRX pouch was used. The pocket was closed with continuous 2-0 and 3-0 V-Loc. Dermabond was used to close the superficial layer.    The device was evaluated by the representative of the cardiac device manufacturer under the supervision of the attending physician with implant parameters listed below.   Complications: none Estimated blood loss: less than 50 mL  Implanted Hardware: Implant Name Type Inv. Item Serial No. Manufacturer Lot No. LRB No. Used Action  ICD GALLANT DR DF1 [id number] - [id number] ICD Generator ICD GALLANT DR DF1 [id number] ABBOTT [employer]  N/A 1 Implanted  POUCH AIGIS-R ANTIBACT ICD LRG - [id number] Mesh General POUCH AIGIS-R ANTIBACT ICD LRG  [employer]HY[employer]M [employer]NAG[employer] [id number] N/A 1 Implanted  SJCR 2411-36 ELLIPSE DR [id number]   [id number] ST JUDE MED CARDIAC RHYTHM M   1 Explanted   Device Information: ICD Generator: [employer] DR [id number], [place] [id number], DOI 06/21/24  Lead Interrogation Data: RA: 4.1 mV / 430 ohms / 0.75 V @ 0.4 ms RV: 8.7 mV / 490 ohms / 0.75 V @ 0.4 ms Shock Impedance: 82 ohms  Summary: 1. Successful LEFT Abbott DC ICD generator replacement.   Recommendations: Routine post-procedure care with bedrest for 1 hour.  No heparin  (IV or subcutaneous) for 48 hours.  No enoxaparin (IV or subcutaneous) for 7 days.  Discharge after post-procedure bedrest.  James DELENA Primus, MD [facility]  Cardiac Electrophysiology      "

## 2024-06-22 ENCOUNTER — Encounter (HOSPITAL_COMMUNITY): Payer: Self-pay | Admitting: Student in an Organized Health Care Education/Training Program

## 2024-06-22 MED FILL — Midazolam HCl Inj 2 MG/2ML (Base Equivalent): INTRAMUSCULAR | Qty: 1 | Status: AC

## 2024-06-22 MED FILL — Lidocaine HCl Local Preservative Free (PF) Inj 1%: INTRAMUSCULAR | Qty: 30 | Status: AC

## 2024-06-30 ENCOUNTER — Ambulatory Visit

## 2024-07-02 ENCOUNTER — Encounter

## 2024-07-03 ENCOUNTER — Ambulatory Visit

## 2024-07-30 ENCOUNTER — Ambulatory Visit

## 2024-09-20 ENCOUNTER — Ambulatory Visit: Admitting: Cardiology

## 2024-10-29 ENCOUNTER — Ambulatory Visit

## 2025-01-28 ENCOUNTER — Ambulatory Visit

## 2025-04-29 ENCOUNTER — Ambulatory Visit

## 2025-07-29 ENCOUNTER — Ambulatory Visit
